# Patient Record
Sex: Male | Born: 1980 | Race: White | Hispanic: No | Marital: Single | Smoking: Former smoker
Health system: Southern US, Community
[De-identification: ages and names within clinical notes are randomized; demographics above are authoritative.]

## PROBLEM LIST (undated history)

## (undated) HISTORY — PX: WISDOM TOOTH EXTRACTION: SHX21

## (undated) HISTORY — PX: MULTIPLE TOOTH EXTRACTIONS: SHX2053

---

## 2013-11-02 HISTORY — PX: CLAVICLE SURGERY: SHX598

## 2016-10-24 DIAGNOSIS — J209 Acute bronchitis, unspecified: Secondary | ICD-10-CM | POA: Diagnosis not present

## 2017-03-01 ENCOUNTER — Other Ambulatory Visit: Payer: Self-pay | Admitting: Family Medicine

## 2017-03-01 ENCOUNTER — Encounter: Payer: Self-pay | Admitting: Family Medicine

## 2017-03-01 ENCOUNTER — Telehealth: Payer: Self-pay

## 2017-03-01 ENCOUNTER — Ambulatory Visit (INDEPENDENT_AMBULATORY_CARE_PROVIDER_SITE_OTHER): Payer: BLUE CROSS/BLUE SHIELD | Admitting: Family Medicine

## 2017-03-01 ENCOUNTER — Encounter (INDEPENDENT_AMBULATORY_CARE_PROVIDER_SITE_OTHER): Payer: Self-pay

## 2017-03-01 VITALS — BP 106/76 | HR 58 | Temp 98.0°F | Ht 76.0 in | Wt 223.2 lb

## 2017-03-01 DIAGNOSIS — N50812 Left testicular pain: Secondary | ICD-10-CM

## 2017-03-01 DIAGNOSIS — Z113 Encounter for screening for infections with a predominantly sexual mode of transmission: Secondary | ICD-10-CM

## 2017-03-01 DIAGNOSIS — M533 Sacrococcygeal disorders, not elsewhere classified: Secondary | ICD-10-CM | POA: Diagnosis not present

## 2017-03-01 LAB — POC URINALSYSI DIPSTICK (AUTOMATED)
Bilirubin, UA: NEGATIVE
Blood, UA: NEGATIVE
Glucose, UA: NEGATIVE
KETONES UA: NEGATIVE
LEUKOCYTES UA: NEGATIVE
NITRITE UA: NEGATIVE
PROTEIN UA: NEGATIVE
Spec Grav, UA: 1.025 (ref 1.010–1.025)
UROBILINOGEN UA: 0.2 U/dL
pH, UA: 6.5 (ref 5.0–8.0)

## 2017-03-01 LAB — CBC WITH DIFFERENTIAL/PLATELET
BASOS ABS: 0 10*3/uL (ref 0.0–0.1)
Basophils Relative: 0.9 % (ref 0.0–3.0)
EOS PCT: 6.5 % — AB (ref 0.0–5.0)
Eosinophils Absolute: 0.2 10*3/uL (ref 0.0–0.7)
HCT: 44.5 % (ref 39.0–52.0)
HEMOGLOBIN: 14.7 g/dL (ref 13.0–17.0)
Lymphocytes Relative: 45.7 % (ref 12.0–46.0)
Lymphs Abs: 1.3 10*3/uL (ref 0.7–4.0)
MCHC: 33 g/dL (ref 30.0–36.0)
MCV: 92.1 fl (ref 78.0–100.0)
MONO ABS: 0.3 10*3/uL (ref 0.1–1.0)
Monocytes Relative: 11.8 % (ref 3.0–12.0)
Neutro Abs: 1 10*3/uL — ABNORMAL LOW (ref 1.4–7.7)
Neutrophils Relative %: 35.1 % — ABNORMAL LOW (ref 43.0–77.0)
Platelets: 131 10*3/uL — ABNORMAL LOW (ref 150.0–400.0)
RBC: 4.83 Mil/uL (ref 4.22–5.81)
RDW: 12.9 % (ref 11.5–15.5)
WBC: 2.9 10*3/uL — AB (ref 4.0–10.5)

## 2017-03-01 LAB — SEDIMENTATION RATE: SED RATE: 1 mm/h (ref 0–15)
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
## 2017-03-01 NOTE — Assessment & Plan Note (Signed)
Longstanding low grade dull discomfort, most recently localizing to L testicle. Exam benign today, not consistent with STI, hernia, no masses appreciated, prostate nontender and nonboggy pointing against prostatitis.  ?chronic prostatitis vs congestive epididymitis.  ?referred pain from sacrum. He does wear restrictive sports briefs - rec looser boxers. rec 1 wk aleve  course scheduled BID.  Check testicular US given longevity of sxs.  Consider urology referral. Pt agrees with plan.

## 2017-03-01 NOTE — Patient Instructions (Addendum)
Labs today. Urine check today.  We will refer you for scrotal ultrasound. See Shirlee Limerick on your way out to schedule. We will be in touch with results.  Use looser boxers. Try aleve course  twice daily with meals for 1 week.  Good to meet you today, call us with questions.  Return in 3 months for physical.

## 2017-03-01 NOTE — Telephone Encounter (Signed)
Ordered
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
## 2017-03-01 NOTE — Telephone Encounter (Signed)
Cathy at [facility] left v/m; needs order for Ultrasound doppler be added in Epic; this needs to be added to US scrotum. IMG code 2191. As soon as order is put in can contact pt to schedule appt.Please advise.

## 2017-03-02 LAB — RPR

## 2017-03-02 LAB — GC/CHLAMYDIA PROBE AMP
CT Probe RNA: NOT DETECTED
GC PROBE AMP APTIMA: NOT DETECTED

## 2017-03-02 LAB — HIV ANTIBODY (ROUTINE TESTING W REFLEX): HIV 1&2 Ab, 4th Generation: NONREACTIVE

## 2017-03-03 ENCOUNTER — Other Ambulatory Visit: Payer: Self-pay | Admitting: Family Medicine

## 2017-03-03 DIAGNOSIS — D709 Neutropenia, unspecified: Secondary | ICD-10-CM

## 2017-03-03 DIAGNOSIS — D696 Thrombocytopenia, unspecified: Secondary | ICD-10-CM

## 2017-03-15 ENCOUNTER — Other Ambulatory Visit: Payer: BLUE CROSS/BLUE SHIELD

## 2017-03-30 ENCOUNTER — Ambulatory Visit
Admission: RE | Admit: 2017-03-30 | Discharge: 2017-03-30 | Disposition: A | Discharge status: Home / Self Care | Payer: BLUE CROSS/BLUE SHIELD | Source: Ambulatory Visit | Attending: Family Medicine | Admitting: Family Medicine

## 2017-03-30 DIAGNOSIS — N50812 Left testicular pain: Secondary | ICD-10-CM | POA: Diagnosis not present

## 2017-04-01 ENCOUNTER — Telehealth: Payer: Self-pay

## 2017-04-01 NOTE — Telephone Encounter (Signed)
Ok to refill - let us know when he needs refill. Will recommend yearly physical and will discuss pros/cons of medication at next OV.

## 2017-04-01 NOTE — Telephone Encounter (Signed)
Pt left v/m; pt established care on 03/01/17; pt received finasteride 5 mg taking 1/4 pill daily for thinning hair by [facility]. Pt wants to know if Dr Reece AgarG could begin to prescribe the Finasteride. Pt request cb. [facility] [address].

## 2017-04-02 MED ORDER — FINASTERIDE 5 MG PO TABS: 1.2500 mg | ORAL_TABLET | Freq: Every day | ORAL | 1 refills | Status: DC

## 2017-04-02 NOTE — Telephone Encounter (Signed)
Lm on pts vm requesting a call back. Rx sent to pharmacy

## 2017-04-02 NOTE — Telephone Encounter (Signed)
[name]  Patient can be reached [phone].

## 2017-04-05 NOTE — Telephone Encounter (Signed)
Spoke to pt and advised per Dr Reece AgarG. CPE scheduled for 8/1

## 2017-06-02 ENCOUNTER — Encounter: Payer: BLUE CROSS/BLUE SHIELD | Admitting: Family Medicine

## 2017-06-11 ENCOUNTER — Encounter: Payer: BLUE CROSS/BLUE SHIELD | Admitting: Family Medicine

## 2017-12-07 ENCOUNTER — Ambulatory Visit (INDEPENDENT_AMBULATORY_CARE_PROVIDER_SITE_OTHER): Payer: BLUE CROSS/BLUE SHIELD | Admitting: Family Medicine

## 2017-12-07 ENCOUNTER — Other Ambulatory Visit: Payer: BLUE CROSS/BLUE SHIELD

## 2017-12-07 ENCOUNTER — Encounter: Payer: Self-pay | Admitting: Family Medicine

## 2017-12-07 VITALS — BP 122/80 | HR 61 | Temp 98.2°F | Ht 76.0 in | Wt 225.5 lb

## 2017-12-07 DIAGNOSIS — Z23 Encounter for immunization: Secondary | ICD-10-CM

## 2017-12-07 DIAGNOSIS — Z131 Encounter for screening for diabetes mellitus: Secondary | ICD-10-CM

## 2017-12-07 DIAGNOSIS — D696 Thrombocytopenia, unspecified: Secondary | ICD-10-CM

## 2017-12-07 DIAGNOSIS — Z0000 Encounter for general adult medical examination without abnormal findings: Secondary | ICD-10-CM

## 2017-12-07 DIAGNOSIS — Z13220 Encounter for screening for lipoid disorders: Secondary | ICD-10-CM | POA: Diagnosis not present

## 2017-12-07 DIAGNOSIS — L649 Androgenic alopecia, unspecified: Secondary | ICD-10-CM | POA: Diagnosis not present

## 2017-12-07 DIAGNOSIS — D709 Neutropenia, unspecified: Secondary | ICD-10-CM | POA: Diagnosis not present

## 2017-12-07 LAB — COMPREHENSIVE METABOLIC PANEL
ALT: 13 U/L (ref 0–53)
AST: 15 U/L (ref 0–37)
Albumin: 4.5 g/dL (ref 3.5–5.2)
Alkaline Phosphatase: 54 U/L (ref 39–117)
BUN: 20 mg/dL (ref 6–23)
CHLORIDE: 104 meq/L (ref 96–112)
CO2: 31 meq/L (ref 19–32)
CREATININE: 0.9 mg/dL (ref 0.40–1.50)
Calcium: 9.3 mg/dL (ref 8.4–10.5)
GFR: 101.68 mL/min (ref >60.00)
GLUCOSE: 88 mg/dL (ref 70–99)
POTASSIUM: 4.2 meq/L (ref 3.5–5.1)
SODIUM: 140 meq/L (ref 135–145)
Total Bilirubin: 0.7 mg/dL (ref 0.2–1.2)
Total Protein: 7.1 g/dL (ref 6.0–8.3)

## 2017-12-07 LAB — TSH: TSH: 1.77 u[IU]/mL (ref 0.35–4.50)

## 2017-12-07 LAB — CBC WITH DIFFERENTIAL/PLATELET
BASOS PCT: 0.9 % (ref 0.0–3.0)
Basophils Absolute: 0 10*3/uL (ref 0.0–0.1)
EOS PCT: 4.9 % (ref 0.0–5.0)
Eosinophils Absolute: 0.1 10*3/uL (ref 0.0–0.7)
HCT: 42.4 % (ref 39.0–52.0)
Hemoglobin: 14.5 g/dL (ref 13.0–17.0)
LYMPHS ABS: 1.3 10*3/uL (ref 0.7–4.0)
Lymphocytes Relative: 46.7 % — ABNORMAL HIGH (ref 12.0–46.0)
MCHC: 34.2 g/dL (ref 30.0–36.0)
MCV: 90.7 fl (ref 78.0–100.0)
MONO ABS: 0.3 10*3/uL (ref 0.1–1.0)
MONOS PCT: 11.8 % (ref 3.0–12.0)
NEUTROS ABS: 1 10*3/uL — AB (ref 1.4–7.7)
NEUTROS PCT: 35.7 % — AB (ref 43.0–77.0)
Platelets: 155 10*3/uL (ref 150.0–400.0)
RBC: 4.68 Mil/uL (ref 4.22–5.81)
RDW: 12.1 % (ref 11.5–15.5)
WBC: 2.9 10*3/uL — ABNORMAL LOW (ref 4.0–10.5)

## 2017-12-07 LAB — LIPID PANEL
CHOL/HDL RATIO: 5
Cholesterol: 185 mg/dL (ref 0–200)
HDL: 38.4 mg/dL — ABNORMAL LOW (ref >39.00)
LDL CALC: 130 mg/dL — AB (ref 0–99)
NonHDL: 146.74
Triglycerides: 83 mg/dL (ref 0.0–149.0)
VLDL: 16.6 mg/dL (ref 0.0–40.0)

## 2017-12-07 MED ORDER — FINASTERIDE 5 MG PO TABS: 1.2500 mg | ORAL_TABLET | Freq: Every day | ORAL | 6 refills | Status: DC

## 2017-12-07 NOTE — Assessment & Plan Note (Signed)
Preventative protocols reviewed and updated unless pt declined. Discussed healthy diet and lifestyle.  

## 2017-12-07 NOTE — Assessment & Plan Note (Signed)
Update CBC. 

## 2017-12-07 NOTE — Patient Instructions (Addendum)
Flu shot and Hep A shot today - to complete Hep A series.  Labs today.  You are doing well - continue healthy diet and lifestyle changes.  Return as needed or in 1 year for next physical.  Health Maintenance, Male A healthy lifestyle and preventive care is important for your health and wellness. Ask your health care provider about what schedule of regular examinations is right for you. What should I know about weight and diet? Eat a Healthy Diet  Eat plenty of vegetables, fruits, whole grains, low-fat dairy products, and lean protein.  Do not eat a lot of foods high in solid fats, added sugars, or salt.  Maintain a Healthy Weight Regular exercise can help you achieve or maintain a healthy weight. You should:  Do at least 150 minutes of exercise each week. The exercise should increase your heart rate and make you sweat (moderate-intensity exercise).  Do strength-training exercises at least twice a week.  Watch Your Levels of Cholesterol and Blood Lipids  Have your blood tested for lipids and cholesterol every 5 years starting at 36 years of age. If you are at high risk for heart disease, you should start having your blood tested when you are 36 years old. You may need to have your cholesterol levels checked more often if: ? Your lipid or cholesterol levels are high. ? You are older than 36 years of age. ? You are at high risk for heart disease.  What should I know about cancer screening? Many types of cancers can be detected early and may often be prevented. Lung Cancer  You should be screened every year for lung cancer if: ? You are a current smoker who has smoked for at least 30 years. ? You are a former smoker who has quit within the past 15 years.  Talk to your health care provider about your screening options, when you should start screening, and how often you should be screened.  Colorectal Cancer  Routine colorectal cancer screening usually begins at 36 years of age and  should be repeated every 5-10 years until you are 36 years old. You may need to be screened more often if early forms of precancerous polyps or small growths are found. Your health care provider may recommend screening at an earlier age if you have risk factors for colon cancer.  Your health care provider may recommend using home test kits to check for hidden blood in the stool.  A small camera at the end of a tube can be used to examine your colon (sigmoidoscopy or colonoscopy). This checks for the earliest forms of colorectal cancer.  Prostate and Testicular Cancer  Depending on your age and overall health, your health care provider may do certain tests to screen for prostate and testicular cancer.  Talk to your health care provider about any symptoms or concerns you have about testicular or prostate cancer.  Skin Cancer  Check your skin from head to toe regularly.  Tell your health care provider about any new moles or changes in moles, especially if: ? There is a change in a mole's size, shape, or color. ? You have a mole that is larger than a pencil eraser.  Always use sunscreen. Apply sunscreen liberally and repeat throughout the day.  Protect yourself by wearing long sleeves, pants, a wide-brimmed hat, and sunglasses when outside.  What should I know about heart disease, diabetes, and high blood pressure?  If you are 3018-36 years of age, have your blood  pressure checked every 3-5 years. If you are 69 years of age or older, have your blood pressure checked every year. You should have your blood pressure measured twice-once when you are at a hospital or clinic, and once when you are not at a hospital or clinic. Record the average of the two measurements. To check your blood pressure when you are not at a hospital or clinic, you can use: ? An automated blood pressure machine at a pharmacy. ? A home blood pressure monitor.  Talk to your health care provider about your target blood  pressure.  If you are between 10-25 years old, ask your health care provider if you should take aspirin to prevent heart disease.  Have regular diabetes screenings by checking your fasting blood sugar level. ? If you are at a normal weight and have a low risk for diabetes, have this test once every three years after the age of 101. ? If you are overweight and have a high risk for diabetes, consider being tested at a younger age or more often.  A one-time screening for abdominal aortic aneurysm (AAA) by ultrasound is recommended for men aged 70-75 years who are current or former smokers. What should I know about preventing infection? Hepatitis B If you have a higher risk for hepatitis B, you should be screened for this virus. Talk with your health care provider to find out if you are at risk for hepatitis B infection. Hepatitis C Blood testing is recommended for:  Everyone born from [birth year] through 1964.  Anyone with known risk factors for hepatitis C.  Sexually Transmitted Diseases (STDs)  You should be screened each year for STDs including gonorrhea and chlamydia if: ? You are sexually active and are younger than 36 years of age. ? You are older than 36 years of age and your health care provider tells you that you are at risk for this type of infection. ? Your sexual activity has changed since you were last screened and you are at an increased risk for chlamydia or gonorrhea. Ask your health care provider if you are at risk.  Talk with your health care provider about whether you are at high risk of being infected with HIV. Your health care provider may recommend a prescription medicine to help prevent HIV infection.  What else can I do?  Schedule regular health, dental, and eye exams.  Stay current with your vaccines (immunizations).  Do not use any tobacco products, such as cigarettes, chewing tobacco, and e-cigarettes. If you need help quitting, ask your health care  provider.  Limit alcohol intake to no more than 2 drinks per day. One drink equals 12 ounces of beer, 5 ounces of wine, or 1 ounces of hard liquor.  Do not use street drugs.  Do not share needles.  Ask your health care provider for help if you need support or information about quitting drugs.  Tell your health care provider if you often feel depressed.  Tell your health care provider if you have ever been abused or do not feel safe at home. This information is not intended to replace advice given to you by your health care provider. Make sure you discuss any questions you have with your health care provider. Document Released: 04/16/2008 Document Revised: 06/17/2016 Document Reviewed: 07/23/2015 Elsevier Interactive Patient Education  [employer].

## 2017-12-07 NOTE — Assessment & Plan Note (Signed)
Discussed mechanism of action as well as possible side effects - minimal at low dose he uses. Reviewed possible decreased risk of prostate cancer, but possible increased association for high grade prostate cancer if this develops. Pt desires to continue medication at this time.

## 2017-12-07 NOTE — Progress Notes (Addendum)
BP 122/80 (BP Location: Right Arm, Patient Position: Sitting, Cuff Size: Normal)   Pulse 61   Temp 98.2 F (36.8 C) (Oral)   Ht 6\' 4"  (1.93 m)   Wt 225 lb 8 oz (102.3 kg)   SpO2 99%   BMI 27.45 kg/m    CC: CPE Subjective:    Patient ID: Hall BusingWilliam Bradford Cavanaugh, male    DOB: 10/30/1981, 37 y.o.   MRN: [id number]  HPI: Hall BusingWilliam Bradford Berberich is a 37 y.o. male presenting on 12/07/2017 for Annual Exam   On finasteride 5mg  1/4 tab daily for thinning hair. [facility]. Desires PCP continue filling.   Preventative: Not currently sexually active. STD screen last year normal. Declines rpt today.  Flu shot yearly Tdap 2014 Hep A 2014, unsure if he completed series  Seat belt use discussed Sunscreen use discussed. No changing moles on skin.  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 Interim medical history since our last visit reviewed. Allergies and medications reviewed and updated. Outpatient Medications Prior to Visit  Medication Sig Dispense Refill  . finasteride (PROSCAR) 5 MG tablet Take 0.5 tablets (2.5 mg total) by mouth daily. 30 tablet 1   No facility-administered medications prior to visit.      Per HPI unless specifically indicated in ROS section below Review of Systems  Constitutional: Negative for activity change, appetite change, chills, fatigue, fever and unexpected weight change.  HENT: Negative for hearing loss.   Eyes: Negative for visual disturbance.  Respiratory: Negative for cough, chest tightness, shortness of breath and wheezing.   Cardiovascular: Negative for chest pain, palpitations and leg swelling.  Gastrointestinal:  Negative for abdominal distention, abdominal pain, blood in stool, constipation, diarrhea, nausea and vomiting.  Genitourinary: Negative for difficulty urinating and hematuria.  Musculoskeletal: Negative for arthralgias, myalgias and neck pain.  Skin: Negative for rash.  Neurological: Negative for dizziness, seizures, syncope and headaches.  Hematological: Negative for adenopathy. Does not bruise/bleed easily.  Psychiatric/Behavioral: Negative for dysphoric mood. The patient is not nervous/anxious.        Objective:    BP 122/80 (BP Location: Right Arm, Patient Position: Sitting, Cuff Size: Normal)   Pulse 61   Temp 98.2 F (36.8 C) (Oral)   Ht 6\' 4"  (1.93 m)   Wt 225 lb 8 oz (102.3 kg)   SpO2 99%   BMI 27.45 kg/m   Wt Readings from Last 3 Encounters:  12/07/17 225 lb 8 oz (102.3 kg)  03/01/17 223 lb 4 oz (101.3 kg)    Physical Exam  Constitutional: He is oriented to person, place, and time. He appears well-developed and well-nourished. No distress.  HENT:  Head: Normocephalic and atraumatic.  Right Ear: Hearing, tympanic membrane, external ear and ear canal normal.  Left Ear: Hearing, tympanic membrane, external ear and ear canal normal.  Nose: Nose normal.  Mouth/Throat: Uvula is midline, oropharynx is clear and moist and mucous membranes are normal. No oropharyngeal exudate, posterior oropharyngeal edema or posterior oropharyngeal erythema.  Eyes: Conjunctivae and EOM are normal. Pupils are equal, round, and reactive to light. No scleral icterus.  Neck: Normal range of motion. Neck supple. No thyromegaly present.  Cardiovascular:  Normal rate, regular rhythm, normal heart sounds and intact distal pulses.  No murmur heard. Pulses:      Radial pulses are 2+ on the right side, and 2+ on the left side.  Pulmonary/Chest: Effort normal and breath sounds normal. No respiratory distress. He has no wheezes. He has no rales.  Abdominal: Soft. Bowel sounds are normal. He exhibits no  distension and no mass. There is no tenderness. There is no rebound and no guarding.  Musculoskeletal: Normal range of motion. He exhibits no edema.  Lymphadenopathy:    He has no cervical adenopathy.  Neurological: He is alert and oriented to person, place, and time.  CN grossly intact, station and gait intact  Skin: Skin is warm and dry. No rash noted.  Psychiatric: He has a normal mood and affect. His behavior is normal. Judgment and thought content normal.  Nursing note and vitals reviewed.  Results for orders placed or performed in visit on 03/01/17  GC/Chlamydia Probe Amp  Result Value Ref Range   CT Probe RNA NOT DETECTED    GC Probe RNA NOT DETECTED   HIV antibody  Result Value Ref Range   HIV 1&2 Ab, 4th Generation NONREACTIVE NONREACTIVE  RPR  Result Value Ref Range   RPR Ser Ql NON REAC NON REAC  Sedimentation rate  Result Value Ref Range   Sed Rate 1 0 - 15 mm/hr  CBC with Differential/Platelet  Result Value Ref Range   WBC 2.9 (L) 4.0 - 10.5 K/uL   RBC 4.83 4.22 - 5.81 Mil/uL   Hemoglobin 14.7 13.0 - 17.0 g/dL   HCT 40.9 81.1 - 91.4 %   MCV 92.1 78.0 - 100.0 fl   MCHC 33.0 30.0 - 36.0 g/dL   RDW 78.2 95.6 - 21.3 %   Platelets 131.0 (L) 150.0 - 400.0 K/uL   Neutrophils Relative % 35.1 (L) 43.0 - 77.0 %   Lymphocytes Relative 45.7 12.0 - 46.0 %   Monocytes Relative 11.8 3.0 - 12.0 %   Eosinophils Relative 6.5 (H) 0.0 - 5.0 %   Basophils Relative 0.9 0.0 - 3.0 %   Neutro Abs 1.0 (L) 1.4 - 7.7 K/uL   Lymphs Abs 1.3 0.7 - 4.0 K/uL   Monocytes Absolute 0.3 0.1 - 1.0 K/uL   Eosinophils Absolute 0.2 0.0 - 0.7 K/uL   Basophils Absolute 0.0 0.0 - 0.1 K/uL  POCT Urinalysis Dipstick (Automated)  Result Value Ref Range   Color, UA yeloow    Clarity, UA clear    Glucose, UA neg    Bilirubin, UA neg    Ketones, UA neg    Spec Grav, UA 1.025 1.010 - 1.025   Blood, UA neg    pH, UA 6.5 5.0 - 8.0   Protein, UA neg    Urobilinogen, UA 0.2 0.2 or 1.0 E.U./dL    Nitrite, UA neg    Leukocytes, UA Negative Negative      Assessment & Plan:   Problem List Items Addressed This Visit    Androgenic alopecia    Discussed mechanism of action as well as possible side effects - minimal at low dose he uses. Reviewed possible decreased risk of prostate cancer, but possible increased association for high grade prostate cancer if this develops. Pt desires to continue medication at this time.       Health maintenance examination - Primary    Preventative protocols reviewed and updated unless pt declined. Discussed healthy diet and lifestyle.  Neutropenia (HCC)    Update CBC      Relevant Orders   TSH   CBC with Differential/Platelet   Pathologist smear review   Thrombocytopenia (HCC)   Relevant Orders   Pathologist smear review    Other Visit Diagnoses    Need for influenza vaccination       Relevant Orders   Flu Vaccine QUAD 6+ mos PF IM (Fluarix Quad PF) (Completed)   Lipid screening       Relevant Orders   Lipid panel   Diabetes mellitus screening       Relevant Orders   Comprehensive metabolic panel       Follow up plan: Return in about 1 year (around 12/07/2018) for annual exam, prior fasting for blood work.  Eustaquio Boyden, MD

## 2017-12-07 NOTE — Addendum Note (Signed)
Addended by: Nanci PinaGOINS, Emonte Dieujuste on: 12/07/2017 10:47 AM   Modules accepted: Orders

## 2017-12-08 LAB — PATHOLOGIST SMEAR REVIEW

## 2018-01-10 ENCOUNTER — Encounter: Payer: Self-pay | Admitting: Family Medicine

## 2018-03-27 ENCOUNTER — Encounter: Payer: Self-pay | Admitting: Family Medicine

## 2018-04-01 ENCOUNTER — Ambulatory Visit (INDEPENDENT_AMBULATORY_CARE_PROVIDER_SITE_OTHER): Payer: BLUE CROSS/BLUE SHIELD | Admitting: Family Medicine

## 2018-04-01 ENCOUNTER — Encounter: Payer: Self-pay | Admitting: Family Medicine

## 2018-04-01 VITALS — BP 118/68 | HR 70 | Temp 98.5°F | Ht 76.0 in | Wt 230.8 lb

## 2018-04-01 DIAGNOSIS — K219 Gastro-esophageal reflux disease without esophagitis: Secondary | ICD-10-CM | POA: Insufficient documentation

## 2018-04-01 DIAGNOSIS — J029 Acute pharyngitis, unspecified: Secondary | ICD-10-CM | POA: Diagnosis not present

## 2018-04-01 DIAGNOSIS — J392 Other diseases of pharynx: Secondary | ICD-10-CM | POA: Insufficient documentation

## 2018-04-01 NOTE — Patient Instructions (Signed)
You had acute pharyngitis - either viral or irritative. Push fluids and plenty of rest. May use ibuprofen for throat inflammation. Watch for fever >101.5, worsening pain, or trouble opening/closing mouth, or hoarse voice.  Good to see you today, call clinic with questions.

## 2018-04-01 NOTE — Assessment & Plan Note (Signed)
Anticipate viral vs irritant pharyngitis. Supportive care reviewed - NSAID, fluids. Should improve over time. 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 Update if not improving with treatment.

## 2018-04-01 NOTE — Progress Notes (Signed)
BP 118/68 (BP Location: Left Arm, Patient Position: Sitting, Cuff Size: Large)   Pulse 70   Temp 98.5 F (36.9 C) (Oral)   Ht 6\' 4"  (1.93 m)   Wt 230 lb 12 oz (104.7 kg)   SpO2 96%   BMI 28.09 kg/m    CC: ST Subjective:    Patient ID: Anthony Martin, male    DOB: 10-05-1981, 37 y.o.   MRN: [id number]  HPI: Anthony Martin is a 37 y.o. male presenting on 04/01/2018 for Sore Throat (Has had about 2 wks. Not "normal" sore throat. Feels dry and like there is a lump. Disturbs pt's sleep. )   2 wk h/o ST, lingering. Throat feels dry, "lump in throat". Can wake him up at night. Slowly improving. Initially did have dry cough which has improved.   Never had congestion, rhinorrhea, fevers/chills, PNDrainage.  Treated with cough drops and nyquil.  No sick contacts at home.   Has stopped vaping.   Relevant past medical, surgical, family and social history reviewed and updated as indicated. Interim medical history since our last visit reviewed. Allergies and medications reviewed and updated. Outpatient Medications Prior to Visit  Medication Sig Dispense Refill  . finasteride (PROSCAR) 5 MG tablet Take 0.5 tablets (2.5 mg total) by mouth daily. 30 tablet 6   No facility-administered medications prior to visit.      Per HPI unless specifically indicated in ROS section below Review of Systems     Objective:    BP 118/68 (BP Location: Left Arm, Patient Position: Sitting, Cuff Size: Large)   Pulse 70   Temp 98.5 F (36.9 C) (Oral)   Ht 6\' 4"  (1.93 m)   Wt 230 lb 12 oz (104.7 kg)   SpO2 96%   BMI 28.09 kg/m   Wt Readings from Last 3 Encounters:  04/01/18 230 lb 12 oz (104.7 kg)  12/07/17 225 lb 8 oz (102.3 kg)  03/01/17 223 lb 4 oz (101.3 kg)    Physical Exam  Constitutional: He appears well-developed and well-nourished. No distress.  HENT:  Head: Normocephalic and atraumatic.  Right Ear: Hearing, tympanic membrane, external ear and ear canal normal.  Left  Ear: Hearing, tympanic membrane, external ear and ear canal normal.  Nose: Nose normal. No mucosal edema or rhinorrhea.  Mouth/Throat: Uvula is midline, oropharynx is clear and moist and mucous membranes are normal. No oropharyngeal exudate, posterior oropharyngeal edema, posterior oropharyngeal erythema or tonsillar abscesses.  Mild erythema posterior soft palate  Eyes: Pupils are equal, round, and reactive to light. Conjunctivae and EOM are normal. No scleral icterus.  Neck: Normal range of motion. Neck supple.  Cardiovascular: Normal rate, regular rhythm, normal heart sounds and intact distal pulses.  No murmur heard. Pulmonary/Chest: Effort normal and breath sounds normal. No respiratory distress. He has no wheezes. He has no rales.  Lymphadenopathy:    He has no cervical adenopathy.  Skin: Skin is warm and dry. No rash noted.  Nursing note and vitals reviewed.     Assessment & Plan:   Problem List Items Addressed This Visit    Sore throat - Primary    Anticipate viral vs irritant pharyngitis. Supportive care reviewed - NSAID, fluids. Should improve over time. 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 Update if not improving with treatment.           No orders of the defined types were placed in this encounter.  No orders of the defined types were placed in  this encounter.   Follow up plan: Return if symptoms worsen or fail to improve.  Eustaquio BoydenJavier Kenisha Lynds, MD

## 2018-05-07 IMAGING — US US SCROTUM
1 series · 14 of 25 positions shown · non-contrast
Comparison: None.

CLINICAL DATA: Chronic left testicle pain.

EXAM:
SCROTAL ULTRASOUND
DOPPLER ULTRASOUND OF THE TESTICLES
TECHNIQUE: Complete ultrasound examination of the testicles, epididymis, and
other scrotal structures was performed. Color and spectral Doppler
ultrasound were also utilized to evaluate blood flow to the
testicles.

[Series 1: us scrotum · 0.08mm/px · 14 of 47 slices shown]
[im 1/47]
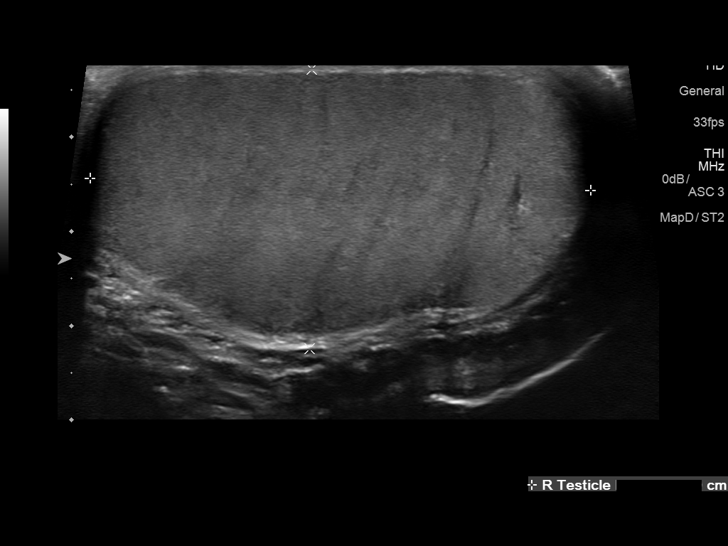
[im 4/47]
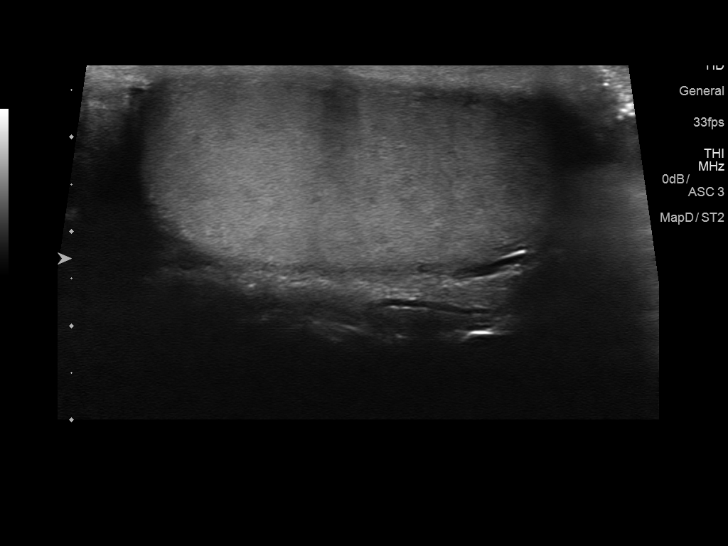
[im 8/47]
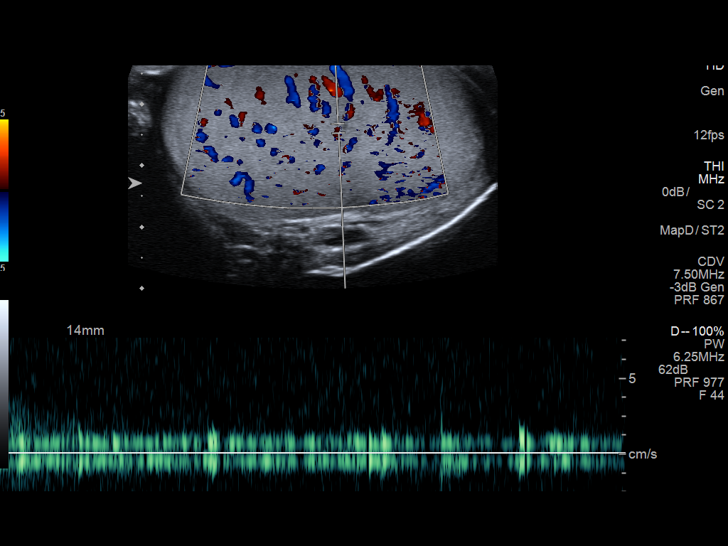
[im 12/47]
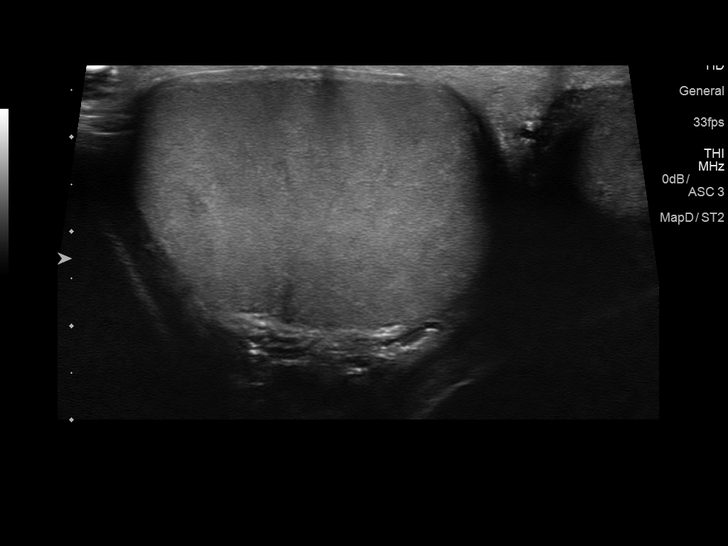
[im 16/47]
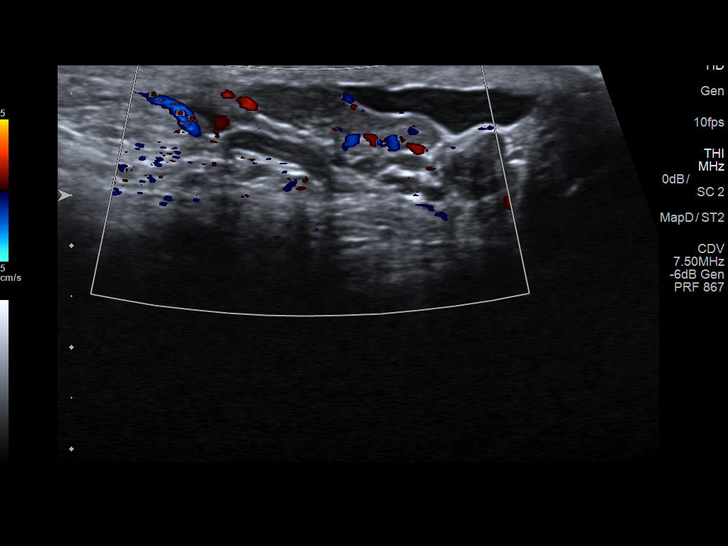
[im 18/47]
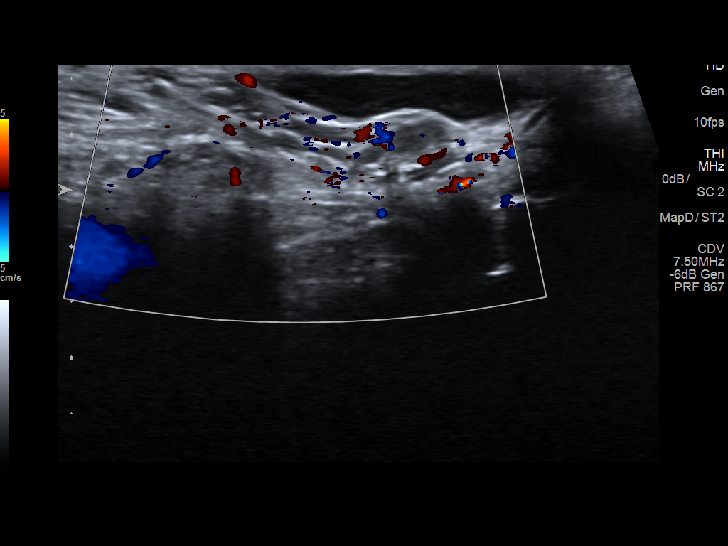
[im 22/47]
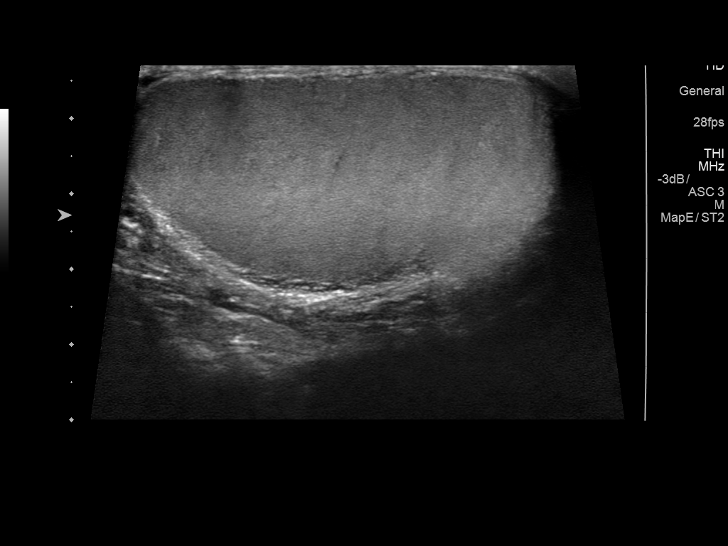
[im 25/47]
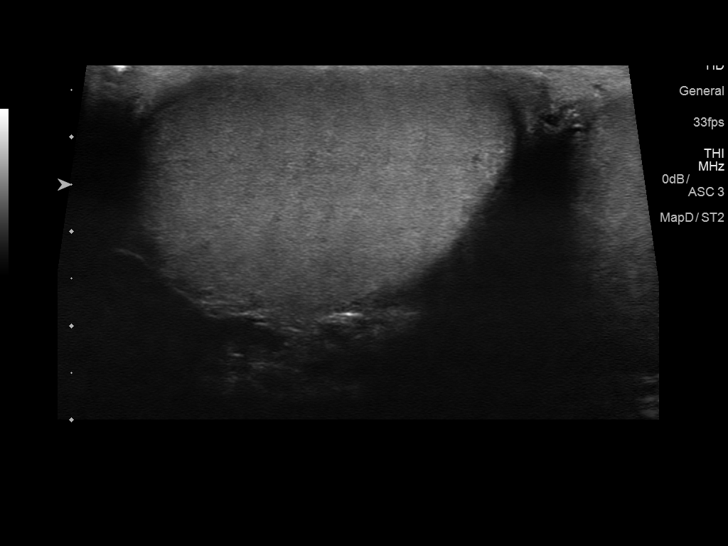
[im 29/47]
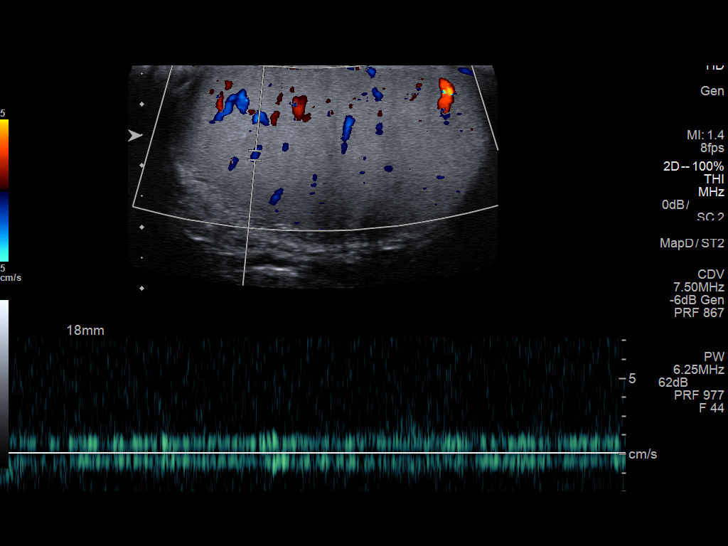
[im 31/47]
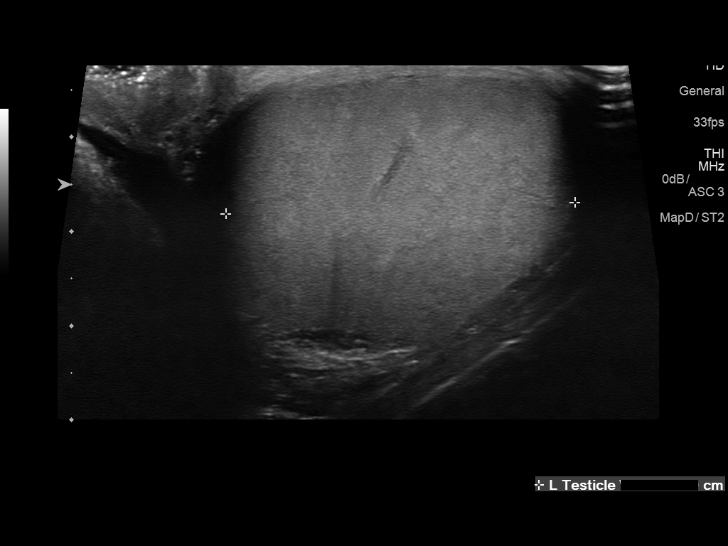
[im 35/47]
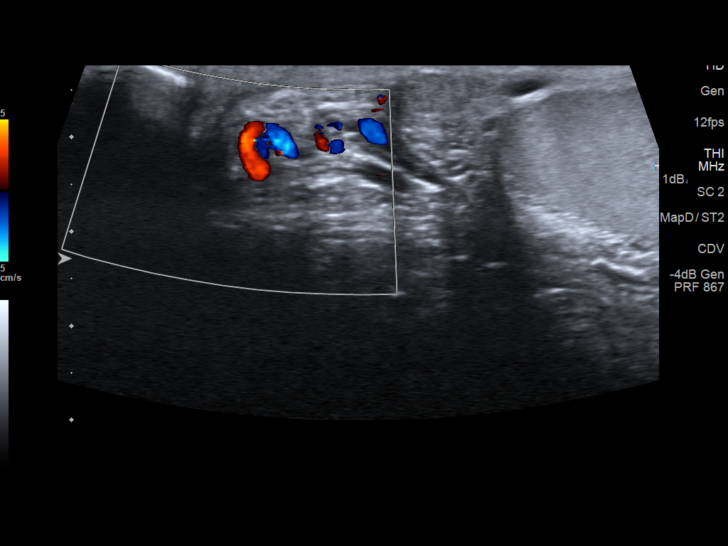
[im 39/47]
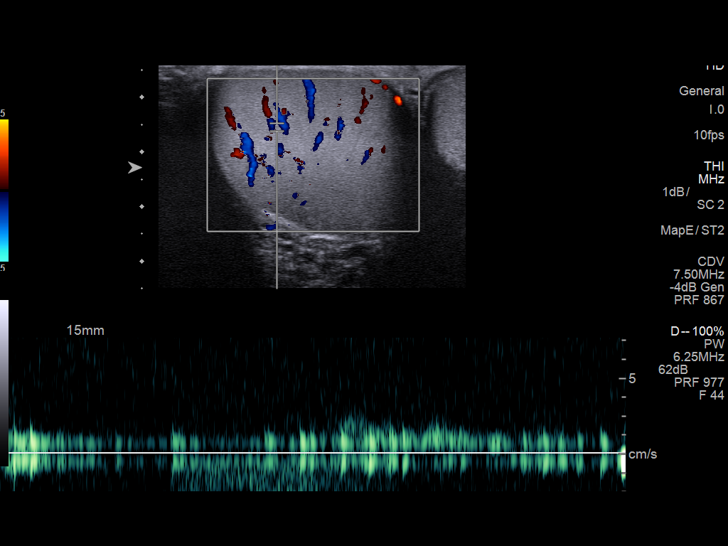
[im 43/47]
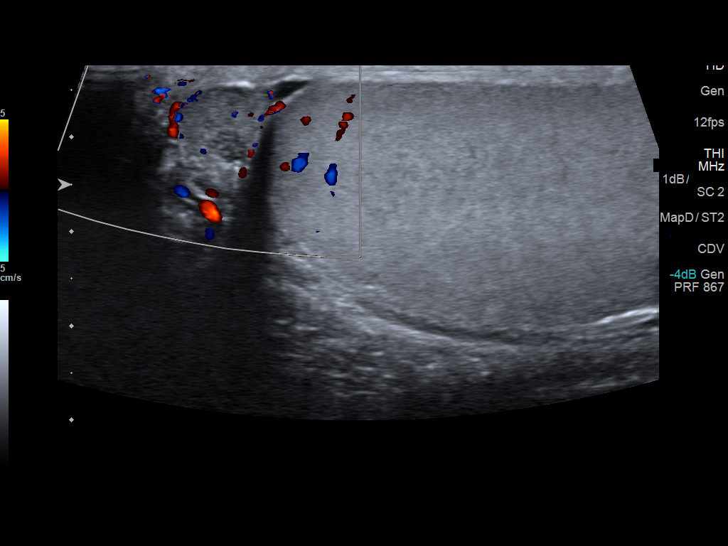
[im 47/47]
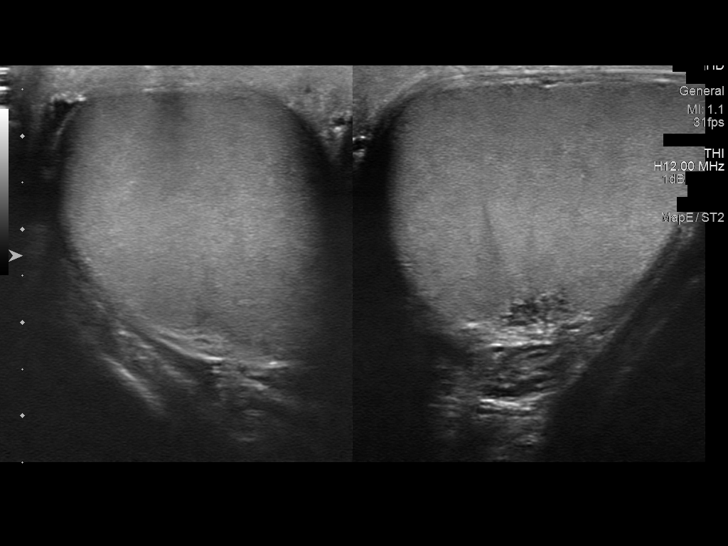

[14 of 25 positions shown; findings below may reference images not displayed]

FINDINGS: Right testicle

Measurements: 5.3 x 3.0 x 3.8 cm. No mass or microlithiasis
visualized.

Left testicle

Measurements: 5.3 x 2.9 x 3.7 cm. No mass or microlithiasis
visualized.

Right epididymis:  Normal in size and appearance.

Left epididymis:  Normal in size and appearance.

Hydrocele:  None visualized.

Varicocele:  None visualized.

Pulsed Doppler interrogation of both testes demonstrates normal low
resistance arterial and venous waveforms bilaterally.
IMPRESSION: Normal exam.

## 2018-11-15 ENCOUNTER — Telehealth: Payer: Self-pay | Admitting: Family Medicine

## 2018-11-15 NOTE — Telephone Encounter (Signed)
Left message asking pt to call office please r/s 2/10 appointment with dr g °

## 2018-12-09 ENCOUNTER — Other Ambulatory Visit: Payer: BLUE CROSS/BLUE SHIELD

## 2018-12-12 ENCOUNTER — Encounter: Payer: BLUE CROSS/BLUE SHIELD | Admitting: Family Medicine

## 2019-01-08 ENCOUNTER — Other Ambulatory Visit: Payer: Self-pay | Admitting: Family Medicine

## 2019-01-12 NOTE — Progress Notes (Signed)
BP 118/78 (BP Location: Left Arm, Patient Position: Sitting, Cuff Size: Large)   Pulse 74   Temp 98.2 F (36.8 C) (Oral)   Ht  (1.93 m)   Wt 234 lb 6 oz (106.3 kg)   SpO2 97%   BMI 28.53 kg/m    CC: CPE Subjective:    Patient ID: Anthony Martin, male    DOB: 03-15-1981, 38 y.o.   MRN: [id number]  HPI: Anthony Martin is a 38 y.o. 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   Noticing some trouble waking up in am, trouble staying asleep.  Last month had sinus congestion.  No daytime sleepiness. Restorative sleep. Does snore. No PN dyspnea.  H/o map dot fingerprint dystrophy.   Preventative: Not currently sexually active. STD screen last year normal. Declines rpt today.  Flu shot yearly Tdap 2014 Hep A series completed  Seat belt use discussed  Sunscreen use discussed. No changing moles on skin.  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 Interim medical history since our last visit reviewed. Allergies and medications reviewed and updated. Outpatient Medications Prior to Visit  Medication Sig Dispense Refill  . finasteride (PROSCAR) 5 MG tablet Take 1/2 (one-half) tablet by mouth once daily 15 tablet 0   No facility-administered medications prior to visit.      Per HPI unless specifically indicated in ROS section below Review of Systems  Constitutional: Negative for activity change, appetite change, chills, fatigue, fever and unexpected weight change.  HENT:  Negative for hearing loss.   Eyes: Negative for visual disturbance.  Respiratory: Negative for cough, chest tightness, shortness of breath and wheezing.   Cardiovascular: Negative for chest pain, palpitations and leg swelling.  Gastrointestinal: Negative for abdominal distention, abdominal pain, blood in stool, constipation, diarrhea, nausea and vomiting.  Genitourinary: Negative for difficulty urinating and hematuria.  Musculoskeletal: Negative for arthralgias, myalgias and neck pain.  Skin: Negative for rash.  Neurological: Negative for dizziness, seizures, syncope and headaches.  Hematological: Negative for adenopathy. Does not bruise/bleed easily.  Psychiatric/Behavioral: Negative for dysphoric mood. The patient is not nervous/anxious.    Objective:    BP 118/78 (BP Location: Left Arm, Patient Position: Sitting, Cuff Size: Large)   Pulse 74   Temp 98.2 F (36.8 C) (Oral)   Ht  (1.93 m)   Wt 234 lb 6 oz (106.3 kg)   SpO2 97%   BMI 28.53 kg/m   Wt Readings from Last 3 Encounters:  01/13/19 234 lb 6 oz (106.3 kg)  04/01/18 230 lb 12 oz (104.7 kg)  12/07/17 225 lb 8 oz (102.3 kg)    Physical Exam Vitals signs and nursing note reviewed.  Constitutional:      General: He is not in acute distress.    Appearance: Normal appearance. He is well-developed.  HENT:     Head: Normocephalic and atraumatic.     Right Ear: Hearing, tympanic membrane, ear canal and external ear normal.     Left  Ear: Hearing, tympanic membrane, ear canal and external ear normal.     Nose: Nose normal.     Mouth/Throat:     Mouth: Mucous membranes are moist.     Pharynx: Uvula midline. No oropharyngeal exudate or posterior oropharyngeal erythema.  Eyes:     General: No scleral icterus.    Conjunctiva/sclera: Conjunctivae normal.     Pupils: Pupils are equal, round, and reactive to light.  Neck:     Musculoskeletal: Normal range of motion and neck supple.  Cardiovascular:     Rate and Rhythm:  Normal rate and regular rhythm.     Pulses: Normal pulses.          Radial pulses are 2+ on the right side and 2+ on the left side.     Heart sounds: Normal heart sounds. No murmur.  Pulmonary:     Effort: Pulmonary effort is normal. No respiratory distress.     Breath sounds: Normal breath sounds. No wheezing, rhonchi or rales.  Abdominal:     General: Abdomen is flat. Bowel sounds are normal. There is no distension.     Palpations: Abdomen is soft. There is no mass.     Tenderness: There is no abdominal tenderness. There is no guarding or rebound.  Musculoskeletal: Normal range of motion.  Lymphadenopathy:     Cervical: No cervical adenopathy.  Skin:    General: Skin is warm and dry.     Findings: No rash.  Neurological:     Mental Status: He is alert and oriented to person, place, and time.     Comments: CN grossly intact, station and gait intact  Psychiatric:        Mood and Affect: Mood normal.        Behavior: Behavior normal.        Thought Content: Thought content normal.        Judgment: Judgment normal.       Results for orders placed or performed in visit on 12/07/17  Lipid panel  Result Value Ref Range   Cholesterol 185 0 - 200 mg/dL   Triglycerides 03.1 0.0 - 149.0 mg/dL   HDL 28.11 (L) >88.67 mg/dL   VLDL 73.7 0.0 - 36.6 mg/dL   LDL Cholesterol 815 (H) 0 - 99 mg/dL   Total CHOL/HDL Ratio 5    NonHDL 146.74   Comprehensive metabolic panel  Result Value Ref Range   Sodium 140 135 - 145 mEq/L   Potassium 4.2 3.5 - 5.1 mEq/L   Chloride 104 96 - 112 mEq/L   CO2 31 19 - 32 mEq/L   Glucose, Bld 88 70 - 99 mg/dL   BUN 20 6 - 23 mg/dL   Creatinine, Ser 9.47 0.40 - 1.50 mg/dL   Total Bilirubin 0.7 0.2 - 1.2 mg/dL   Alkaline Phosphatase 54 39 - 117 U/L   AST 15 0 - 37 U/L   ALT 13 0 - 53 U/L   Total Protein 7.1 6.0 - 8.3 g/dL   Albumin 4.5 3.5 - 5.2 g/dL   Calcium 9.3 8.4 - 07.6 mg/dL   GFR 151.83 >43.73 mL/min  TSH  Result Value Ref Range   TSH 1.77 0.35 -  4.50 uIU/mL  CBC with Differential/Platelet  Result Value Ref Range   WBC 2.9 (L) 4.0 - 10.5 K/uL   RBC 4.68 4.22 - 5.81 Mil/uL   Hemoglobin 14.5 13.0 - 17.0 g/dL   HCT 57.8 97.8 - 47.8 %   MCV 90.7 78.0 -  100.0 fl   MCHC 34.2 30.0 - 36.0 g/dL   RDW 96.2 95.2 - 84.1 %   Platelets 155.0 150.0 - 400.0 K/uL   Neutrophils Relative % 35.7 (L) 43.0 - 77.0 %   Lymphocytes Relative 46.7 (H) 12.0 - 46.0 %   Monocytes Relative 11.8 3.0 - 12.0 %   Eosinophils Relative 4.9 0.0 - 5.0 %   Basophils Relative 0.9 0.0 - 3.0 %   Neutro Abs 1.0 (L) 1.4 - 7.7 K/uL   Lymphs Abs 1.3 0.7 - 4.0 K/uL   Monocytes Absolute 0.3 0.1 - 1.0 K/uL   Eosinophils Absolute 0.1 0.0 - 0.7 K/uL   Basophils Absolute 0.0 0.0 - 0.1 K/uL  Pathologist smear review  Result Value Ref Range   Path Review     Lab Results  Component Value Date   TSH 1.77 12/07/2017    Assessment & Plan:   Problem List Items Addressed This Visit    Throat irritation    Describes sensation of throat irritation at times at night, benign exam today. Not consistent with OSA. ESS = 6. ?GERD - suggested he monitor foods at night that can precede symptoms.       Neutropenia (HCC)    Reviewed with patient. Update CBC.       Relevant Orders   CBC with Differential/Platelet   Health maintenance examination - Primary    Preventative protocols reviewed and updated unless pt declined. Discussed healthy diet and lifestyle.       Dyslipidemia   Relevant Orders   Lipid panel   Basic metabolic panel   Androgenic alopecia    propecia refilled. Tolerating well.        Other Visit Diagnoses    Need for influenza vaccination       Relevant Orders   Flu Vaccine QUAD 36+ mos IM (Completed)   Screen for STD (sexually transmitted disease)       Relevant Orders   HIV Antibody (routine testing w rflx)   RPR   C. trachomatis/N. gonorrhoeae RNA       Meds ordered this encounter  Medications  . finasteride (PROSCAR) 5 MG tablet    Sig: Take  1/2 (one-half) tablet by mouth once daily    Dispense:  45 tablet    Refill:  3   Orders Placed This Encounter  Procedures  . C. trachomatis/N. gonorrhoeae RNA  . Flu Vaccine QUAD 36+ mos IM  . Lipid panel  . CBC with Differential/Platelet  . Basic metabolic panel  . HIV Antibody (routine testing w rflx)  . RPR    Follow up plan: No follow-ups on file.  Eustaquio Boyden, MD

## 2019-01-13 ENCOUNTER — Encounter: Payer: Self-pay | Admitting: Family Medicine

## 2019-01-13 ENCOUNTER — Ambulatory Visit (INDEPENDENT_AMBULATORY_CARE_PROVIDER_SITE_OTHER): Payer: 59 | Admitting: Family Medicine

## 2019-01-13 ENCOUNTER — Other Ambulatory Visit: Payer: Self-pay

## 2019-01-13 VITALS — BP 118/78 | HR 74 | Temp 98.2°F | Ht 76.0 in | Wt 234.4 lb

## 2019-01-13 DIAGNOSIS — D709 Neutropenia, unspecified: Secondary | ICD-10-CM

## 2019-01-13 DIAGNOSIS — Z0000 Encounter for general adult medical examination without abnormal findings: Secondary | ICD-10-CM

## 2019-01-13 DIAGNOSIS — L649 Androgenic alopecia, unspecified: Secondary | ICD-10-CM

## 2019-01-13 DIAGNOSIS — Z113 Encounter for screening for infections with a predominantly sexual mode of transmission: Secondary | ICD-10-CM | POA: Diagnosis not present

## 2019-01-13 DIAGNOSIS — E785 Hyperlipidemia, unspecified: Secondary | ICD-10-CM

## 2019-01-13 DIAGNOSIS — Z0001 Encounter for general adult medical examination with abnormal findings: Secondary | ICD-10-CM | POA: Diagnosis not present

## 2019-01-13 DIAGNOSIS — Z23 Encounter for immunization: Secondary | ICD-10-CM

## 2019-01-13 DIAGNOSIS — J392 Other diseases of pharynx: Secondary | ICD-10-CM

## 2019-01-13 LAB — LIPID PANEL
CHOL/HDL RATIO: 4
CHOLESTEROL: 189 mg/dL (ref 0–200)
HDL: 47.4 mg/dL (ref >39.00)
LDL Cholesterol: 126 mg/dL — ABNORMAL HIGH (ref 0–99)
NONHDL: 141.47
TRIGLYCERIDES: 79 mg/dL (ref 0.0–149.0)
VLDL: 15.8 mg/dL (ref 0.0–40.0)

## 2019-01-13 LAB — BASIC METABOLIC PANEL
BUN: 15 mg/dL (ref 6–23)
CHLORIDE: 102 meq/L (ref 96–112)
CO2: 30 mEq/L (ref 19–32)
Calcium: 9.7 mg/dL (ref 8.4–10.5)
Creatinine, Ser: 1.01 mg/dL (ref 0.40–1.50)
GFR: 83.24 mL/min (ref >60.00)
Glucose, Bld: 82 mg/dL (ref 70–99)
POTASSIUM: 4.2 meq/L (ref 3.5–5.1)
SODIUM: 139 meq/L (ref 135–145)

## 2019-01-13 LAB — CBC WITH DIFFERENTIAL/PLATELET
BASOS ABS: 0 10*3/uL (ref 0.0–0.1)
Basophils Relative: 1.1 % (ref 0.0–3.0)
EOS ABS: 0.2 10*3/uL (ref 0.0–0.7)
Eosinophils Relative: 6 % — ABNORMAL HIGH (ref 0.0–5.0)
HEMATOCRIT: 44.1 % (ref 39.0–52.0)
HEMOGLOBIN: 15.1 g/dL (ref 13.0–17.0)
LYMPHS PCT: 44 % (ref 12.0–46.0)
Lymphs Abs: 1.5 10*3/uL (ref 0.7–4.0)
MCHC: 34.3 g/dL (ref 30.0–36.0)
MCV: 90 fl (ref 78.0–100.0)
MONO ABS: 0.3 10*3/uL (ref 0.1–1.0)
Monocytes Relative: 10.1 % (ref 3.0–12.0)
Neutro Abs: 1.3 10*3/uL — ABNORMAL LOW (ref 1.4–7.7)
Neutrophils Relative %: 38.8 % — ABNORMAL LOW (ref 43.0–77.0)
Platelets: 160 10*3/uL (ref 150.0–400.0)
RBC: 4.9 Mil/uL (ref 4.22–5.81)
RDW: 13 % (ref 11.5–15.5)
WBC: 3.4 10*3/uL — AB (ref 4.0–10.5)

## 2019-01-13 MED ORDER — FINASTERIDE 5 MG PO TABS: ORAL_TABLET | ORAL | 3 refills | Status: DC

## 2019-01-13 NOTE — Assessment & Plan Note (Signed)
Preventative protocols reviewed and updated unless pt declined. Discussed healthy diet and lifestyle.  

## 2019-01-13 NOTE — Patient Instructions (Addendum)
Flu shot today Labs today.  Good to see you today, call us with questions.   Sleep hygiene checklist: 1. Avoid naps during the day 2. Avoid stimulants such as caffeine and nicotine. Avoid bedtime alcohol (it can speed onset of sleep but the body's metabolism can cause awakenings). 3. All forms of exercise help ensure sound sleep - limit vigorous exercise to morning or late afternoon 4. Avoid food too close to bedtime including chocolate (which contains caffeine) 5. Soak up natural light 6. Establish regular bedtime routine. 7. Associate bed with sleep - avoid TV, computer or phone, reading while in bed. 8. Ensure pleasant, relaxing sleep environment - quiet, dark, cool room.    Health Maintenance, Male A healthy lifestyle and preventive care is important for your health and wellness. Ask your health care provider about what schedule of regular examinations is right for you. What should I know about weight and diet? Eat a Healthy Diet  Eat plenty of vegetables, fruits, whole grains, low-fat dairy products, and lean protein.  Do not eat a lot of foods high in solid fats, added sugars, or salt.  Maintain a Healthy Weight Regular exercise can help you achieve or maintain a healthy weight. You should:  Do at least 150 minutes of exercise each week. The exercise should increase your heart rate and make you sweat (moderate-intensity exercise).  Do strength-training exercises at least twice a week. Watch Your Levels of Cholesterol and Blood Lipids  Have your blood tested for lipids and cholesterol every 5 years starting at 37 years of age. If you are at high risk for heart disease, you should start having your blood tested when you are 37 years old. You may need to have your cholesterol levels checked more often if: ? Your lipid or cholesterol levels are high. ? You are older than 37 years of age. ? You are at high risk for heart disease. What should I know about cancer screening? Many  types of cancers can be detected early and may often be prevented. Lung Cancer  You should be screened every year for lung cancer if: ? You are a current smoker who has smoked for at least 30 years. ? You are a former smoker who has quit within the past 15 years.  Talk to your health care provider about your screening options, when you should start screening, and how often you should be screened. Colorectal Cancer  Routine colorectal cancer screening usually begins at 37 years of age and should be repeated every 5-10 years until you are 37 years old. You may need to be screened more often if early forms of precancerous polyps or small growths are found. Your health care provider may recommend screening at an earlier age if you have risk factors for colon cancer.  Your health care provider may recommend using home test kits to check for hidden blood in the stool.  A small camera at the end of a tube can be used to examine your colon (sigmoidoscopy or colonoscopy). This checks for the earliest forms of colorectal cancer. Prostate and Testicular Cancer  Depending on your age and overall health, your health care provider may do certain tests to screen for prostate and testicular cancer.  Talk to your health care provider about any symptoms or concerns you have about testicular or prostate cancer. Skin Cancer  Check your skin from head to toe regularly.  Tell your health care provider about any new moles or changes in moles, especially if: ?  There is a change in a mole's size, shape, or color. ? You have a mole that is larger than a pencil eraser.  Always use sunscreen. Apply sunscreen liberally and repeat throughout the day.  Protect yourself by wearing long sleeves, pants, a wide-brimmed hat, and sunglasses when outside. What should I know about heart disease, diabetes, and high blood pressure?  If you are 8-25 years of age, have your blood pressure checked every 3-5 years. If you are  75 years of age or older, have your blood pressure checked every year. You should have your blood pressure measured twice-once when you are at a hospital or clinic, and once when you are not at a hospital or clinic. Record the average of the two measurements. To check your blood pressure when you are not at a hospital or clinic, you can use: ? An automated blood pressure machine at a pharmacy. ? A home blood pressure monitor.  Talk to your health care provider about your target blood pressure.  If you are between 61-3 years old, ask your health care provider if you should take aspirin to prevent heart disease.  Have regular diabetes screenings by checking your fasting blood sugar level. ? If you are at a normal weight and have a low risk for diabetes, have this test once every three years after the age of 76. ? If you are overweight and have a high risk for diabetes, consider being tested at a younger age or more often.  A one-time screening for abdominal aortic aneurysm (AAA) by ultrasound is recommended for men aged 65-75 years who are current or former smokers. What should I know about preventing infection? Hepatitis B If you have a higher risk for hepatitis B, you should be screened for this virus. Talk with your health care provider to find out if you are at risk for hepatitis B infection. Hepatitis C Blood testing is recommended for:  Everyone born from [birth year] through 1964.  Anyone with known risk factors for hepatitis C. Sexually Transmitted Diseases (STDs)  You should be screened each year for STDs including gonorrhea and chlamydia if: ? You are sexually active and are younger than 37 years of age. ? You are older than 37 years of age and your health care provider tells you that you are at risk for this type of infection. ? Your sexual activity has changed since you were last screened and you are at an increased risk for chlamydia or gonorrhea. Ask your health care provider if you  are at risk.  Talk with your health care provider about whether you are at high risk of being infected with HIV. Your health care provider may recommend a prescription medicine to help prevent HIV infection. What else can I do?  Schedule regular health, dental, and eye exams.  Stay current with your vaccines (immunizations).  Do not use any tobacco products, such as cigarettes, chewing tobacco, and e-cigarettes. If you need help quitting, ask your health care provider.  Limit alcohol intake to no more than 2 drinks per day. One drink equals 12 ounces of beer, 5 ounces of wine, or 1 ounces of hard liquor.  Do not use street drugs.  Do not share needles.  Ask your health care provider for help if you need support or information about quitting drugs.  Tell your health care provider if you often feel depressed.  Tell your health care provider if you have ever been abused or do not feel safe at home.  This information is not intended to replace advice given to you by your health care provider. Make sure you discuss any questions you have with your health care provider. Document Released: 04/16/2008 Document Revised: 06/17/2016 Document Reviewed: 07/23/2015 Elsevier Interactive Patient Education  2019 [employer].

## 2019-01-13 NOTE — Assessment & Plan Note (Signed)
Describes sensation of throat irritation at times at night, benign exam today. Not consistent with OSA. ESS = 6. ?GERD - suggested he monitor foods at night that can precede symptoms.

## 2019-01-13 NOTE — Assessment & Plan Note (Signed)
propecia refilled. Tolerating well.

## 2019-01-13 NOTE — Assessment & Plan Note (Signed)
Reviewed with patient. Update CBC.

## 2019-01-14 ENCOUNTER — Telehealth: Payer: Self-pay | Admitting: Family Medicine

## 2019-01-14 LAB — C. TRACHOMATIS/N. GONORRHOEAE RNA
C. trachomatis RNA, TMA: NOT DETECTED
N. gonorrhoeae RNA, TMA: NOT DETECTED

## 2019-01-14 NOTE — Telephone Encounter (Signed)
plz phone in finasteride due to E prescribing error.

## 2019-01-16 LAB — RPR: RPR Ser Ql: NONREACTIVE

## 2019-01-16 LAB — HIV ANTIBODY (ROUTINE TESTING W REFLEX): HIV: NONREACTIVE

## 2019-01-16 NOTE — Telephone Encounter (Signed)
Refill left on vm at pharmacy.  

## 2019-09-12 ENCOUNTER — Encounter: Payer: Self-pay | Admitting: Family Medicine

## 2019-09-12 ENCOUNTER — Telehealth (INDEPENDENT_AMBULATORY_CARE_PROVIDER_SITE_OTHER): Payer: 59 | Admitting: Family Medicine

## 2019-09-12 DIAGNOSIS — R059 Cough, unspecified: Secondary | ICD-10-CM | POA: Insufficient documentation

## 2019-09-12 DIAGNOSIS — R05 Cough: Secondary | ICD-10-CM | POA: Diagnosis not present

## 2019-09-12 NOTE — Assessment & Plan Note (Addendum)
Dry cough of 1 + month duration worse at night time. No allergic symptoms. Doubt infectious. Anticipate GERD related. Reviewed with patient, reviewed diet changes to help control reflux. Suggested 2 wk course of prilosec or nexium OTC strength, update me over MyChart with effect.

## 2019-09-12 NOTE — Progress Notes (Signed)
Virtual visit completed through [place]. Due to national recommendations of social distancing due to COVID-19, a virtual visit is felt to be most appropriate for this patient at this time. Reviewed limitations of a virtual visit.   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    Pulse (!) 58   Temp (!) 97.3 F (36.3 C)   Ht 6\' 4"  (1.93 m)   Wt 240 lb (108.9 kg)   BMI 29.21 kg/m    CC: cough Subjective:    Patient ID: Anthony Martin, male    DOB: 03/04/1981, 38 y.o.   MRN: [id number]  HPI: Anthony Martin is a 38 y.o. male presenting on 09/12/2019 for Cough (C/o dry cough at night, sometimes resolved with a drink of water. Occasionally coughs during the day. Started about 1 mo ago. Tried Delsym, barely helpful. )   1 mo h/o dry nagging cough present mainly at night time. Cough can wake him up in the middle of the night with coughing fit for 30-45 sec then goes away, drinking water helps improve symptoms. Treated with delsym with limited benefit. Some improvement noted over the past week.    Denies significant GERD. He does like spicy foods. Drinks alcohol and caffeine regularly.  Denies significant allergic rhinitis.  No asthma history No sick contacts at home. Quit smoking 10/2018 - even quit E-cigs/vaping earlier this year.  Normal exercising without cough.   Denies ST, malaise, PNdrainage.      Relevant past medical, surgical, family and social history reviewed and updated as indicated. Interim medical history since our last visit reviewed. Allergies and medications reviewed and updated. Outpatient Medications Prior to Visit  Medication Sig Dispense Refill  . finasteride (PROSCAR) 5 MG tablet Take 1/2 (one-half) tablet by mouth once daily 45 tablet 3   No facility-administered medications prior to visit.      Per HPI  unless specifically indicated in ROS section below Review of Systems Objective:    Pulse (!) 58   Temp (!) 97.3 F (36.3 C)   Ht 6\' 4"  (1.93 m)   Wt 240 lb (108.9 kg)   BMI 29.21 kg/m   Wt Readings from Last 3 Encounters:  09/12/19 240 lb (108.9 kg)  01/13/19 234 lb 6 oz (106.3 kg)  04/01/18 230 lb 12 oz (104.7 kg)     Physical exam: Gen: alert, NAD, not ill appearing Pulm: speaks in complete sentences without increased work of breathing Psych: normal mood, normal thought content      Assessment & Plan:   Problem List Items Addressed This Visit    Cough    Dry cough of 1 + month duration worse at night time. No allergic symptoms. Doubt infectious. Anticipate GERD related. Reviewed with patient, reviewed diet changes to help control reflux. Suggested 2 wk course of prilosec or nexium OTC strength, update me over MyChart with effect.           No orders of the defined types were placed in this encounter.  No orders of the defined types were placed in this encounter.   I discussed the assessment and treatment plan with the patient. The patient was provided an opportunity to ask questions and all were answered. The patient agreed with the plan and demonstrated an understanding of the instructions. The patient was advised to call back or seek an in-person evaluation if  the symptoms worsen or if the condition fails to improve as anticipated.  Follow up plan: Return if symptoms worsen or fail to improve.  Ria Bush, MD

## 2019-10-05 ENCOUNTER — Encounter: Payer: Self-pay | Admitting: Family Medicine

## 2019-10-05 NOTE — Telephone Encounter (Signed)
Spoke with pt asking about any sxs.  States he has no sxs (ie, fever, SOB, body aches, ST, etc.)  The cough he has had for well over a month and Dr. Darnell Level is aware of it.  Pls advise.

## 2019-10-06 MED ORDER — OMEPRAZOLE 40 MG PO CPDR: 40.0000 mg | DELAYED_RELEASE_CAPSULE | Freq: Every day | ORAL | 3 refills | Status: DC

## 2019-11-18 ENCOUNTER — Encounter: Payer: Self-pay | Admitting: Family Medicine

## 2020-02-21 ENCOUNTER — Encounter: Payer: Self-pay | Admitting: Family Medicine

## 2020-02-21 MED ORDER — FINASTERIDE 5 MG PO TABS: ORAL_TABLET | ORAL | 0 refills | Status: DC

## 2020-02-21 NOTE — Telephone Encounter (Signed)
E-scribed refill.  Plz schedule CPE and lab visits. 

## 2020-03-28 ENCOUNTER — Other Ambulatory Visit: Payer: Self-pay

## 2020-03-28 ENCOUNTER — Ambulatory Visit: Payer: 59 | Admitting: Family Medicine

## 2020-03-28 ENCOUNTER — Encounter: Payer: Self-pay | Admitting: Family Medicine

## 2020-03-28 ENCOUNTER — Ambulatory Visit (INDEPENDENT_AMBULATORY_CARE_PROVIDER_SITE_OTHER): Payer: 59 | Admitting: Family Medicine

## 2020-03-28 VITALS — BP 108/78 | HR 77 | Temp 98.2°F | Ht 76.0 in | Wt 234.0 lb

## 2020-03-28 DIAGNOSIS — K219 Gastro-esophageal reflux disease without esophagitis: Secondary | ICD-10-CM | POA: Diagnosis not present

## 2020-03-28 DIAGNOSIS — R059 Cough, unspecified: Secondary | ICD-10-CM

## 2020-03-28 DIAGNOSIS — R05 Cough: Secondary | ICD-10-CM | POA: Diagnosis not present

## 2020-03-28 MED ORDER — OMEPRAZOLE 20 MG PO CPDR: 20.0000 mg | DELAYED_RELEASE_CAPSULE | Freq: Every day | ORAL | 6 refills | Status: DC

## 2020-03-28 NOTE — Patient Instructions (Addendum)
GERD precautions: Head of bed elevated. Avoidance of citrus, fatty foods, chocolate, peppermint, and excessive alcohol, along with sodas, orange juice (acidic drinks) At least a few hours between dinner and bed, minimize naps after eating.   May continue omeprazole 40mg  course for 1-2 weeks for acute flare then transition to omeprazole 20mg  before breakfast or lunch and/or pepcid at night time.  Omperazole 20mg  sent to pharmacy.  If ongoing trouble, let me know to check for H pylori. If normal and still with breakthrough GERD, we could have you see GI.   Gastroesophageal Reflux Disease, Adult Gastroesophageal reflux (GER) happens when acid from the stomach flows up into the tube that connects the mouth and the stomach (esophagus). Normally, food travels down the esophagus and stays in the stomach to be digested. With GER, food and stomach acid sometimes move back up into the esophagus. You may have a disease called gastroesophageal reflux disease (GERD) if the reflux:  Happens often.  Causes frequent or very bad symptoms.  Causes problems such as damage to the esophagus. When this happens, the esophagus becomes sore and swollen (inflamed). Over time, GERD can make small holes (ulcers) in the lining of the esophagus. What are the causes? This condition is caused by a problem with the muscle between the esophagus and the stomach. When this muscle is weak or not normal, it does not close properly to keep food and acid from coming back up from the stomach. The muscle can be weak because of:  Tobacco use.  Pregnancy.  Having a certain type of hernia (hiatal hernia).  Alcohol use.  Certain foods and drinks, such as coffee, chocolate, onions, and peppermint. What increases the risk? You are more likely to develop this condition if you:  Are overweight.  Have a disease that affects your connective tissue.  Use NSAID medicines. What are the signs or symptoms? Symptoms of this condition  include:  Heartburn.  Difficult or painful swallowing.  The feeling of having a lump in the throat.  A bitter taste in the mouth.  Bad breath.  Having a lot of saliva.  Having an upset or bloated stomach.  Belching.  Chest pain. Different conditions can cause chest pain. Make sure you see your doctor if you have chest pain.  Shortness of breath or noisy breathing (wheezing).  Ongoing (chronic) cough or a cough at night.  Wearing away of the surface of teeth (tooth enamel).  Weight loss. How is this treated? Treatment will depend on how bad your symptoms are. Your doctor may suggest:  Changes to your diet.  Medicine.  Surgery. Follow these instructions at home: Eating and drinking   Follow a diet as told by your doctor. You may need to avoid foods and drinks such as: ? Coffee and tea (with or without caffeine). ? Drinks that contain alcohol. ? Energy drinks and sports drinks. ? Bubbly (carbonated) drinks or sodas. ? Chocolate and cocoa. ? Peppermint and mint flavorings. ? Garlic and onions. ? Horseradish. ? Spicy and acidic foods. These include peppers, chili powder, curry powder, vinegar, hot sauces, and BBQ sauce. ? Citrus fruit juices and citrus fruits, such as oranges, lemons, and limes. ? Tomato-based foods. These include red sauce, chili, salsa, and pizza with red sauce. ? Fried and fatty foods. These include donuts, french fries, potato chips, and high-fat dressings. ? High-fat meats. These include hot dogs, rib eye steak, sausage, ham, and bacon. ? High-fat dairy items, such as whole milk, butter, and cream cheese.  Eat small meals often. Avoid eating large meals.  Avoid drinking large amounts of liquid with your meals.  Avoid eating meals during the 2-3 hours before bedtime.  Avoid lying down right after you eat.  Do not exercise right after you eat. Lifestyle   Do not use any products that contain nicotine or tobacco. These include  cigarettes, e-cigarettes, and chewing tobacco. If you need help quitting, ask your doctor.  Try to lower your stress. If you need help doing this, ask your doctor.  If you are overweight, lose an amount of weight that is healthy for you. Ask your doctor about a safe weight loss goal. General instructions  Pay attention to any changes in your symptoms.  Take over-the-counter and prescription medicines only as told by your doctor. Do not take aspirin, ibuprofen, or other NSAIDs unless your doctor says it is okay.  Wear loose clothes. Do not wear anything tight around your waist.  Raise (elevate) the head of your bed about 6 inches (15 cm).  Avoid bending over if this makes your symptoms worse.  Keep all follow-up visits as told by your doctor. This is important. Contact a doctor if:  You have new symptoms.  You lose weight and you do not know why.  You have trouble swallowing or it hurts to swallow.  You have wheezing or a cough that keeps happening.  Your symptoms do not get better with treatment.  You have a hoarse voice. Get help right away if:  You have pain in your arms, neck, jaw, teeth, or back.  You feel sweaty, dizzy, or light-headed.  You have chest pain or shortness of breath.  You throw up (vomit) and your throw-up looks like blood or coffee grounds.  You pass out (faint).  Your poop (stool) is bloody or black.  You cannot swallow, drink, or eat. Summary  If a person has gastroesophageal reflux disease (GERD), food and stomach acid move back up into the esophagus and cause symptoms or problems such as damage to the esophagus.  Treatment will depend on how bad your symptoms are.  Follow a diet as told by your doctor.  Take all medicines only as told by your doctor. This information is not intended to replace advice given to you by your health care provider. Make sure you discuss any questions you have with your health care provider. Document Revised:  04/27/2018 Document Reviewed: 04/27/2018 Elsevier Patient Education  2020 [employer].

## 2020-03-28 NOTE — Progress Notes (Signed)
This visit was conducted in person.  BP 108/78 (BP Location: Left Arm, Patient Position: Sitting, Cuff Size: Large)   Pulse 77   Temp 98.2 F (36.8 C) (Temporal)   Ht 6\' 4"  (1.93 m)   Wt 234 lb (106.1 kg)   SpO2 96%   BMI 28.48 kg/m    CC: cough Subjective:    Patient ID: , male    DOB: 1981/01/16, 39 y.o.   MRN: [id number]  HPI: Anthony Martin is a 39 y.o. male presenting on 03/28/2020 for Cough (C/o cough worse at night even with omeprazole.  Thinks 40 mg is too strong.  Also taking Delsym to help. )   See prior note for details.  Nagging dry cough present for several months, thought hidden GERD - improved on omeprazole 40mg  daily - we transitioned to QOD dosing - he felt symptoms returning when he tried weaning off med (throat clearing, nagging cough returning). He feels 40mg  dose may be too strong - feels bloated feeling after meals when taking.   Cough could wake him up with fits, water helps symptoms. Lost voice this past week, associated with coughing fits. Worse cough with laying supine. Has started taking delsym. Feels globus sensation with swallowing. Currently taking omeprazole 40mg  daily for the past week.   Denies dysphagia, chest or epigastric abdominal pain. No nausea/vomiting or unexpected weight loss.   Fmhx omeprazole use. No fmhx PUD or H pylori.   Drinks 1-5 cups coffee in am  Alcohol - can drink significant alcohol (hard liquor) - more 2 wks ago  Diet - does try to limit spicy foods, acidic foods (although he likes this). No chocolate or mints. No smoking   No significant allergic rhinitis history.   COVID vaccine - hasn't done yet.      Relevant past medical, surgical, family and social history reviewed and updated as indicated. Interim medical history since our last visit reviewed. Allergies and medications reviewed and updated. Outpatient Medications Prior to Visit  Medication Sig Dispense Refill  . finasteride  (PROSCAR) 5 MG tablet Take 1/2 (one-half) tablet by mouth once daily 45 tablet 0  . omeprazole (PRILOSEC) 40 MG capsule Take 1 capsule (40 mg total) by mouth daily. For 3 weeks then as needed 30 capsule 3   No facility-administered medications prior to visit.     Per HPI unless specifically indicated in ROS section below Review of Systems Objective:  BP 108/78 (BP Location: Left Arm, Patient Position: Sitting, Cuff Size: Large)   Pulse 77   Temp 98.2 F (36.8 C) (Temporal)   Ht 6\' 4"  (1.93 m)   Wt 234 lb (106.1 kg)   SpO2 96%   BMI 28.48 kg/m   Wt Readings from Last 3 Encounters:  03/28/20 234 lb (106.1 kg)  09/12/19 240 lb (108.9 kg)  01/13/19 234 lb 6 oz (106.3 kg)      Physical Exam Vitals and nursing note reviewed.  Constitutional:      Appearance: Normal appearance. He is not ill-appearing.  HENT:     Mouth/Throat:     Mouth: Mucous membranes are moist.     Pharynx: Oropharynx is clear. No oropharyngeal exudate or posterior oropharyngeal erythema.  Eyes:     Extraocular Movements: Extraocular movements intact.     Conjunctiva/sclera: Conjunctivae normal.     Pupils: Pupils are equal, round, and reactive to light.  Neck:     Thyroid: No thyromegaly or thyroid tenderness.  Cardiovascular:  Rate and Rhythm: Normal rate and regular rhythm.     Pulses: Normal pulses.     Heart sounds: Normal heart sounds. No murmur.  Pulmonary:     Effort: Pulmonary effort is normal. No respiratory distress.     Breath sounds: Normal breath sounds. No wheezing, rhonchi or rales.  Abdominal:     General: Abdomen is flat. Bowel sounds are normal. There is no distension.     Palpations: Abdomen is soft. There is no mass.     Tenderness: There is no abdominal tenderness. There is no guarding or rebound.     Hernia: No hernia is present.  Neurological:     Mental Status: He is alert.  Psychiatric:        Mood and Affect: Mood normal.        Behavior: Behavior normal.         Assessment & Plan:  This visit occurred during the SARS-CoV-2 public health emergency.  Safety protocols were in place, including screening questions prior to the visit, additional usage of staff PPE, and extensive cleaning of exam room while observing appropriate contact time as indicated for disinfecting solutions.  Discussed COVID vaccine, questions answered.  Problem List Items Addressed This Visit    GERD (gastroesophageal reflux disease) - Primary    Anticipate large portion of cough related to hidden reflux/laryngopharyngeal reflux given symptoms temporally related to increased hard liquor intake at recent wedding. Reviewed dietary precautions to treat GERD as per instructions - he could back down on alcohol and caffeine.  No red flags.  rec continue Rx strength omeprazole 40mg  for 2 wks until acute flare has resolved then transition to omeprazole 20mg  daily (Rx sent in case more affordable).  Discussed checking H pylori or GI referral to eval for other cause (HH, stricture, EoE, etc) if symptoms not resolved with above.       Relevant Medications   omeprazole (PRILOSEC) 20 MG capsule   Cough    Anticipate cough related to hidden reflux. See above.           Meds ordered this encounter  Medications  . omeprazole (PRILOSEC) 20 MG capsule    Sig: Take 1 capsule (20 mg total) by mouth daily.    Dispense:  30 capsule    Refill:  6   No orders of the defined types were placed in this encounter.   Patient instructions: GERD precautions: Head of bed elevated. Avoidance of citrus, fatty foods, chocolate, peppermint, and excessive alcohol, along with sodas, orange juice (acidic drinks) At least a few hours between dinner and bed, minimize naps after eating.   May continue omeprazole 40mg  course for 1-2 weeks for acute flare then transition to omeprazole 20mg  before breakfast or lunch and/or pepcid at night time.  Omperazole 20mg  sent to pharmacy.  If ongoing trouble, let me know  to check for H pylori. If normal and still with breakthrough GERD, we could have you see GI.   Follow up plan: Return if symptoms worsen or fail to improve.  Ria Bush, MD

## 2020-03-30 NOTE — Assessment & Plan Note (Signed)
Anticipate large portion of cough related to hidden reflux/laryngopharyngeal reflux given symptoms temporally related to increased hard liquor intake at recent wedding. Reviewed dietary precautions to treat GERD as per instructions - he could back down on alcohol and caffeine.  No red flags.  rec continue Rx strength omeprazole 40mg  for 2 wks until acute flare has resolved then transition to omeprazole 20mg  daily (Rx sent in case more affordable).  Discussed checking H pylori or GI referral to eval for other cause (HH, stricture, EoE, etc) if symptoms not resolved with above.

## 2020-03-30 NOTE — Assessment & Plan Note (Signed)
Anticipate cough related to hidden reflux. See above.

## 2020-04-09 ENCOUNTER — Encounter: Payer: Self-pay | Admitting: Family Medicine

## 2020-04-09 ENCOUNTER — Ambulatory Visit (INDEPENDENT_AMBULATORY_CARE_PROVIDER_SITE_OTHER): Payer: 59 | Admitting: Family Medicine

## 2020-04-09 ENCOUNTER — Other Ambulatory Visit: Payer: Self-pay

## 2020-04-09 VITALS — BP 120/84 | HR 66 | Temp 98.0°F | Ht 76.0 in | Wt 236.3 lb

## 2020-04-09 DIAGNOSIS — E785 Hyperlipidemia, unspecified: Secondary | ICD-10-CM

## 2020-04-09 DIAGNOSIS — Z113 Encounter for screening for infections with a predominantly sexual mode of transmission: Secondary | ICD-10-CM | POA: Diagnosis not present

## 2020-04-09 DIAGNOSIS — Z0000 Encounter for general adult medical examination without abnormal findings: Secondary | ICD-10-CM

## 2020-04-09 DIAGNOSIS — K219 Gastro-esophageal reflux disease without esophagitis: Secondary | ICD-10-CM

## 2020-04-09 DIAGNOSIS — D709 Neutropenia, unspecified: Secondary | ICD-10-CM | POA: Diagnosis not present

## 2020-04-09 DIAGNOSIS — Z1159 Encounter for screening for other viral diseases: Secondary | ICD-10-CM

## 2020-04-09 DIAGNOSIS — R059 Cough, unspecified: Secondary | ICD-10-CM

## 2020-04-09 DIAGNOSIS — R05 Cough: Secondary | ICD-10-CM

## 2020-04-09 LAB — COMPREHENSIVE METABOLIC PANEL
ALT: 14 U/L (ref 0–53)
AST: 19 U/L (ref 0–37)
Albumin: 4.8 g/dL (ref 3.5–5.2)
Alkaline Phosphatase: 70 U/L (ref 39–117)
BUN: 14 mg/dL (ref 6–23)
CO2: 33 mEq/L — ABNORMAL HIGH (ref 19–32)
Calcium: 9.5 mg/dL (ref 8.4–10.5)
Chloride: 102 mEq/L (ref 96–112)
Creatinine, Ser: 0.94 mg/dL (ref 0.40–1.50)
GFR: 89.82 mL/min (ref >60.00)
Glucose, Bld: 96 mg/dL (ref 70–99)
Potassium: 4.3 mEq/L (ref 3.5–5.1)
Sodium: 138 mEq/L (ref 135–145)
Total Bilirubin: 0.9 mg/dL (ref 0.2–1.2)
Total Protein: 7.2 g/dL (ref 6.0–8.3)

## 2020-04-09 LAB — LIPID PANEL
Cholesterol: 221 mg/dL — ABNORMAL HIGH (ref 0–200)
HDL: 38.5 mg/dL — ABNORMAL LOW (ref >39.00)
LDL Cholesterol: 158 mg/dL — ABNORMAL HIGH (ref 0–99)
NonHDL: 182.01
Total CHOL/HDL Ratio: 6
Triglycerides: 120 mg/dL (ref 0.0–149.0)
VLDL: 24 mg/dL (ref 0.0–40.0)

## 2020-04-09 LAB — CBC WITH DIFFERENTIAL/PLATELET
Basophils Absolute: 0 10*3/uL (ref 0.0–0.1)
Basophils Relative: 0.6 % (ref 0.0–3.0)
Eosinophils Absolute: 0.1 10*3/uL (ref 0.0–0.7)
Eosinophils Relative: 2.9 % (ref 0.0–5.0)
HCT: 42.7 % (ref 39.0–52.0)
Hemoglobin: 14.7 g/dL (ref 13.0–17.0)
Lymphocytes Relative: 42.3 % (ref 12.0–46.0)
Lymphs Abs: 1.7 10*3/uL (ref 0.7–4.0)
MCHC: 34.4 g/dL (ref 30.0–36.0)
MCV: 90.3 fl (ref 78.0–100.0)
Monocytes Absolute: 0.3 10*3/uL (ref 0.1–1.0)
Monocytes Relative: 8.7 % (ref 3.0–12.0)
Neutro Abs: 1.8 10*3/uL (ref 1.4–7.7)
Neutrophils Relative %: 45.5 % (ref 43.0–77.0)
Platelets: 157 10*3/uL (ref 150.0–400.0)
RBC: 4.73 Mil/uL (ref 4.22–5.81)
RDW: 13.1 % (ref 11.5–15.5)
WBC: 4 10*3/uL (ref 4.0–10.5)

## 2020-04-09 NOTE — Assessment & Plan Note (Addendum)
GERD related - improves on omeprazole.

## 2020-04-09 NOTE — Assessment & Plan Note (Signed)
Chronic. No recurrent infections. Update CBC.

## 2020-04-09 NOTE — Assessment & Plan Note (Addendum)
Stable period on omeprazole 40mg  daily - once he finishes this course will start 20mg  dose and try PRN. Discussed H pylori testing if recurrence

## 2020-04-09 NOTE — Assessment & Plan Note (Signed)
Update FLP.  

## 2020-04-09 NOTE — Patient Instructions (Addendum)
Labs today Finish omeprazole 40mg  course then return to 20mg  as needed.  Let know if recurrent symptoms. Watch diet (alcohol, caffeine) to help control GERD symptoms.  You are doing well today Work on healthy diet and lifestyle   Health Maintenance, Male Adopting a healthy lifestyle and getting preventive care are important in promoting health and wellness. Ask your health care provider about:  The right schedule for you to have regular tests and exams.  Things you can do on your own to prevent diseases and keep yourself healthy. What should I know about diet, weight, and exercise? Eat a healthy diet   Eat a diet that includes plenty of vegetables, fruits, low-fat dairy products, and lean protein.  Do not eat a lot of foods that are high in solid fats, added sugars, or sodium. Maintain a healthy weight Body mass index (BMI) is a measurement that can be used to identify possible weight problems. It estimates body fat based on height and weight. Your health care provider can help determine your BMI and help you achieve or maintain a healthy weight. Get regular exercise Get regular exercise. This is one of the most important things you can do for your health. Most adults should:  Exercise for at least 150 minutes each week. The exercise should increase your heart rate and make you sweat (moderate-intensity exercise).  Do strengthening exercises at least twice a week. This is in addition to the moderate-intensity exercise.  Spend less time sitting. Even light physical activity can be beneficial. Watch cholesterol and blood lipids Have your blood tested for lipids and cholesterol at 38 years of age, then have this test every 5 years. You may need to have your cholesterol levels checked more often if:  Your lipid or cholesterol levels are high.  You are older than 38 years of age.  You are at high risk for heart disease. What should I know about cancer screening? Many types of  cancers can be detected early and may often be prevented. Depending on your health history and family history, you may need to have cancer screening at various ages. This may include screening for:  Colorectal cancer.  Prostate cancer.  Skin cancer.  Lung cancer. What should I know about heart disease, diabetes, and high blood pressure? Blood pressure and heart disease  High blood pressure causes heart disease and increases the risk of stroke. This is more likely to develop in people who have high blood pressure readings, are of African descent, or are overweight.  Talk with your health care provider about your target blood pressure readings.  Have your blood pressure checked: ? Every 3-5 years if you are 47-33 years of age. ? Every year if you are 38 years old or older.  If you are between the ages of 18 and 19 and are a current or former smoker, ask your health care provider if you should have a one-time screening for abdominal aortic aneurysm (AAA). Diabetes Have regular diabetes screenings. This checks your fasting blood sugar level. Have the screening done:  Once every three years after age 30 if you are at a normal weight and have a low risk for diabetes.  More often and at a younger age if you are overweight or have a high risk for diabetes. What should I know about preventing infection? Hepatitis B If you have a higher risk for hepatitis B, you should be screened for this virus. Talk with your health care provider to find out if  you are at risk for hepatitis B infection. Hepatitis C Blood testing is recommended for:  Everyone born from [birth year] through 1964.  Anyone with known risk factors for hepatitis C. Sexually transmitted infections (STIs)  You should be screened each year for STIs, including gonorrhea and chlamydia, if: ? You are sexually active and are younger than 38 years of age. ? You are older than 38 years of age and your health care provider tells you that you  are at risk for this type of infection. ? Your sexual activity has changed since you were last screened, and you are at increased risk for chlamydia or gonorrhea. Ask your health care provider if you are at risk.  Ask your health care provider about whether you are at high risk for HIV. Your health care provider may recommend a prescription medicine to help prevent HIV infection. If you choose to take medicine to prevent HIV, you should first get tested for HIV. You should then be tested every 3 months for as long as you are taking the medicine. Follow these instructions at home: Lifestyle  Do not use any products that contain nicotine or tobacco, such as cigarettes, e-cigarettes, and chewing tobacco. If you need help quitting, ask your health care provider.  Do not use street drugs.  Do not share needles.  Ask your health care provider for help if you need support or information about quitting drugs. Alcohol use  Do not drink alcohol if your health care provider tells you not to drink.  If you drink alcohol: ? Limit how much you have to 0-2 drinks a day. ? Be aware of how much alcohol is in your drink. In the [place] one drink equals one 12 oz bottle of beer (355 mL), one 5 oz glass of wine (148 mL), or one 1 oz glass of hard liquor (44 mL). General instructions  Schedule regular health, dental, and eye exams.  Stay current with your vaccines.  Tell your health care provider if: ? You often feel depressed. ? You have ever been abused or do not feel safe at home. Summary  Adopting a healthy lifestyle and getting preventive care are important in promoting health and wellness.  Follow your health care provider's instructions about healthy diet, exercising, and getting tested or screened for diseases.  Follow your health care provider's instructions on monitoring your cholesterol and blood pressure. This information is not intended to replace advice given to you by your health care  provider. Make sure you discuss any questions you have with your health care provider. Document Revised: 10/12/2018 Document Reviewed: 10/12/2018 Elsevier Patient Education  2020 [employer].

## 2020-04-09 NOTE — Assessment & Plan Note (Signed)
Preventative protocols reviewed and updated unless pt declined. Discussed healthy diet and lifestyle.  

## 2020-04-09 NOTE — Progress Notes (Signed)
This visit was conducted in person.  BP 120/84 (BP Location: Left Arm, Patient Position: Sitting, Cuff Size: Large)   Pulse 66   Temp 98 F (36.7 C) (Temporal)   Ht 6\' 4"  (1.93 m)   Wt 236 lb 5 oz (107.2 kg)   SpO2 99%   BMI 28.76 kg/m    CC: CPE Subjective:    Patient ID: , male    DOB: 12-Feb-1981, 39 y.o.   MRN: [id number]  HPI: Anthony Martin is a 39 y.o. male presenting on 04/09/2020 for Annual Exam   See prior note for details. On omeprazole 40mg  daily after GERD flare with benefit.   Preventative: Flu shot yearly  Tdap 2014  COVID vaccine - declines for now  Hep A series completed  Seat belt use discussed - doesn't wear regularly Sunscreen use discussed. No changing moles on skin.  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 Interim medical history since our last visit reviewed. Allergies and medications reviewed and updated. Outpatient Medications Prior to Visit  Medication Sig Dispense Refill  . finasteride (PROSCAR) 5 MG tablet Take 1/2 (one-half) tablet by mouth once daily 45 tablet 0  . omeprazole (PRILOSEC) 20 MG capsule Take 1 capsule (20 mg total) by mouth daily. 30 capsule 6   No facility-administered medications prior to visit.     Per HPI unless specifically indicated in ROS section below Review of Systems  Constitutional: Negative for activity change, appetite change, chills, fatigue, fever and unexpected weight change.  HENT: Negative for hearing loss.   Eyes: Negative for visual disturbance.  Respiratory:  Positive for cough (reflux related). Negative for chest tightness, shortness of breath and wheezing.   Cardiovascular: Negative for chest pain, palpitations and leg swelling.  Gastrointestinal: Positive for abdominal pain. Negative for abdominal distention, blood in stool, constipation, diarrhea, nausea and vomiting.  Genitourinary: Negative for difficulty urinating and hematuria.  Musculoskeletal: Negative for arthralgias, myalgias and neck pain.  Skin: Negative for rash.  Neurological: Negative for dizziness, seizures, syncope and headaches.  Hematological: Negative for adenopathy. Does not bruise/bleed easily.  Psychiatric/Behavioral: Negative for dysphoric mood. The patient is not nervous/anxious.    Objective:  BP 120/84 (BP Location: Left Arm, Patient Position: Sitting, Cuff Size: Large)   Pulse 66   Temp 98 F (36.7 C) (Temporal)   Ht 6\' 4"  (1.93 m)   Wt 236 lb 5 oz (107.2 kg)   SpO2 99%   BMI 28.76 kg/m   Wt Readings from Last 3 Encounters:  04/09/20 236 lb 5 oz (107.2 kg)  03/28/20 234 lb (106.1 kg)  09/12/19 240 lb (108.9 kg)      Physical Exam Vitals and nursing note reviewed.  Constitutional:      General: He is not in acute distress.    Appearance: Normal appearance. He is well-developed. He is not ill-appearing.  HENT:     Head: Normocephalic and atraumatic.     Right Ear: Hearing, tympanic membrane, ear canal and external ear normal.     Left Ear:  Hearing, tympanic membrane, ear canal and external ear normal.  Eyes:     General: No scleral icterus.    Extraocular Movements: Extraocular movements intact.     Conjunctiva/sclera: Conjunctivae normal.     Pupils: Pupils are equal, round, and reactive to light.  Cardiovascular:     Rate and Rhythm: Normal rate and regular rhythm.     Pulses: Normal pulses.          Radial pulses are 2+ on the right side and 2+ on the left side.     Heart sounds: Normal heart sounds. No murmur.  Pulmonary:     Effort: Pulmonary  effort is normal. No respiratory distress.     Breath sounds: Normal breath sounds. No wheezing, rhonchi or rales.  Abdominal:     General: Abdomen is flat. Bowel sounds are normal. There is no distension.     Palpations: Abdomen is soft. There is no mass.     Tenderness: There is no abdominal tenderness. There is no guarding or rebound.     Hernia: No hernia is present.  Musculoskeletal:        General: Normal range of motion.     Cervical back: Normal range of motion and neck supple.     Right lower leg: No edema.     Left lower leg: No edema.  Lymphadenopathy:     Cervical: No cervical adenopathy.  Skin:    General: Skin is warm and dry.     Findings: No rash.  Neurological:     General: No focal deficit present.     Mental Status: He is alert and oriented to person, place, and time.     Comments: CN grossly intact, station and gait intact  Psychiatric:        Mood and Affect: Mood normal.        Behavior: Behavior normal.        Thought Content: Thought content normal.        Judgment: Judgment normal.       Results for orders placed or performed in visit on 01/13/19  C. trachomatis/N. gonorrhoeae RNA   Specimen: Urine  Result Value Ref Range   C. trachomatis RNA, TMA NOT DETECTED NOT DETECT   N. gonorrhoeae RNA, TMA NOT DETECTED NOT DETECT  Lipid panel  Result Value Ref Range   Cholesterol 189 0 - 200 mg/dL   Triglycerides 77.8 0.0 - 149.0 mg/dL   HDL 24.23 >53.61 mg/dL   VLDL 44.3 0.0 - 15.4 mg/dL   LDL Cholesterol 008 (H) 0 - 99 mg/dL   Total CHOL/HDL Ratio 4    NonHDL 141.47   CBC with Differential/Platelet  Result Value Ref Range   WBC 3.4 (L) 4.0 - 10.5 K/uL   RBC 4.90 4.22 - 5.81 Mil/uL   Hemoglobin 15.1 13.0 - 17.0 g/dL   HCT 67.6 19.5 - 09.3 %   MCV 90.0 78.0 - 100.0 fl   MCHC 34.3 30.0 - 36.0 g/dL   RDW 26.7 12.4 - 58.0 %   Platelets 160.0 150.0 - 400.0 K/uL   Neutrophils Relative % 38.8 (L) 43.0 - 77.0 %   Lymphocytes Relative 44.0 12.0 - 46.0 %    Monocytes Relative 10.1 3.0 - 12.0 %   Eosinophils Relative 6.0 (H) 0.0 - 5.0 %   Basophils Relative 1.1 0.0 - 3.0 %   Neutro Abs 1.3 (L) 1.4 - 7.7 K/uL   Lymphs Abs 1.5 0.7 - 4.0 K/uL   Monocytes Absolute  0.3 0.1 - 1.0 K/uL   Eosinophils Absolute 0.2 0.0 - 0.7 K/uL   Basophils Absolute 0.0 0.0 - 0.1 K/uL  Basic metabolic panel  Result Value Ref Range   Sodium 139 135 - 145 mEq/L   Potassium 4.2 3.5 - 5.1 mEq/L   Chloride 102 96 - 112 mEq/L   CO2 30 19 - 32 mEq/L   Glucose, Bld 82 70 - 99 mg/dL   BUN 15 6 - 23 mg/dL   Creatinine, Ser 1.01 0.40 - 1.50 mg/dL   Calcium 9.7 8.4 - 10.5 mg/dL   GFR 83.24 >60.00 mL/min  HIV Antibody (routine testing w rflx)  Result Value Ref Range   HIV 1&2 Ab, 4th Generation NON-REACTIVE NON-REACTI  RPR  Result Value Ref Range   RPR Ser Ql NON-REACTIVE NON-REACTI   Assessment & Plan:  This visit occurred during the SARS-CoV-2 public health emergency.  Safety protocols were in place, including screening questions prior to the visit, additional usage of staff PPE, and extensive cleaning of exam room while observing appropriate contact time as indicated for disinfecting solutions.   Problem List Items Addressed This Visit    Neutropenia ([place])    Chronic. No recurrent infections. Update CBC.       Relevant Orders   CBC with Differential/Platelet   Health maintenance examination - Primary    Preventative protocols reviewed and updated unless pt declined. Discussed healthy diet and lifestyle.       GERD (gastroesophageal reflux disease)    Stable period on omeprazole 40mg  daily - once he finishes this course will start 20mg  dose and try PRN. Discussed H pylori testing if recurrence      Dyslipidemia    Update FLP.        Relevant Orders   Lipid panel   Comprehensive metabolic panel   Cough    GERD related - improves on omeprazole.        Other Visit Diagnoses    Screen for STD (sexually transmitted disease)       Relevant Orders    HIV Antibody (routine testing w rflx)   RPR   Need for hepatitis C screening test       Relevant Orders   Hepatitis C antibody       No orders of the defined types were placed in this encounter.  Orders Placed This Encounter  Procedures  . Lipid panel  . Comprehensive metabolic panel  . CBC with Differential/Platelet  . HIV Antibody (routine testing w rflx)  . RPR  . Hepatitis C antibody    Patient instructions: Labs today Finish omeprazole 40mg  course then return to 20mg  as needed.  Let us know if recurrent symptoms. Watch diet (alcohol, caffeine) to help control GERD symptoms.  You are doing well today Work on healthy diet and lifestyle   Follow up plan: Return in about 1 year (around 04/09/2021) for annual exam, prior fasting for blood work.  Ria Bush, MD

## 2020-04-10 LAB — HIV ANTIBODY (ROUTINE TESTING W REFLEX): HIV 1&2 Ab, 4th Generation: NONREACTIVE

## 2020-04-10 LAB — HEPATITIS C ANTIBODY
Hepatitis C Ab: NONREACTIVE
SIGNAL TO CUT-OFF: 0.01 (ref <1.00)

## 2020-04-10 LAB — RPR: RPR Ser Ql: NONREACTIVE

## 2020-05-31 ENCOUNTER — Encounter: Payer: Self-pay | Admitting: Family Medicine

## 2020-06-03 MED ORDER — FINASTERIDE 5 MG PO TABS: ORAL_TABLET | ORAL | 3 refills | Status: DC

## 2020-06-03 NOTE — Telephone Encounter (Signed)
E-scribed finasteride refill.  Fwd to Dr. Reece Agar to address increase in omeprazole request.

## 2020-06-06 MED ORDER — OMEPRAZOLE 40 MG PO CPDR: 40.0000 mg | DELAYED_RELEASE_CAPSULE | Freq: Every day | ORAL | 3 refills | Status: DC

## 2020-06-06 NOTE — Addendum Note (Signed)
Addended by: Eustaquio Boyden on: 06/06/2020 03:56 PM   Modules accepted: Orders

## 2020-08-05 ENCOUNTER — Encounter: Payer: Self-pay | Admitting: Family Medicine

## 2020-08-05 DIAGNOSIS — K219 Gastro-esophageal reflux disease without esophagitis: Secondary | ICD-10-CM

## 2020-08-09 NOTE — Telephone Encounter (Signed)
plz send last 2 office notes.

## 2021-06-27 ENCOUNTER — Other Ambulatory Visit: Payer: Self-pay | Admitting: Family Medicine

## 2021-09-11 ENCOUNTER — Telehealth: Payer: Self-pay | Admitting: Family Medicine

## 2021-09-11 NOTE — Telephone Encounter (Signed)
Patient has called the office stating he is changing his insurance company and he will be needing the provider NPI number.  Please advise.

## 2021-09-11 NOTE — Telephone Encounter (Signed)
Ok to give patient NPI number.   Please call patient and let him know the number is :  [phone]  Thanks.

## 2021-12-27 ENCOUNTER — Other Ambulatory Visit: Payer: Self-pay | Admitting: Family Medicine

## 2021-12-27 DIAGNOSIS — E785 Hyperlipidemia, unspecified: Secondary | ICD-10-CM

## 2021-12-27 DIAGNOSIS — D696 Thrombocytopenia, unspecified: Secondary | ICD-10-CM

## 2021-12-29 ENCOUNTER — Other Ambulatory Visit (INDEPENDENT_AMBULATORY_CARE_PROVIDER_SITE_OTHER): Payer: 59

## 2021-12-29 ENCOUNTER — Other Ambulatory Visit: Payer: Self-pay

## 2021-12-29 DIAGNOSIS — E785 Hyperlipidemia, unspecified: Secondary | ICD-10-CM | POA: Diagnosis not present

## 2021-12-29 DIAGNOSIS — D696 Thrombocytopenia, unspecified: Secondary | ICD-10-CM | POA: Diagnosis not present

## 2021-12-29 LAB — COMPREHENSIVE METABOLIC PANEL
ALT: 24 U/L (ref 0–53)
AST: 55 U/L — ABNORMAL HIGH (ref 0–37)
Albumin: 4.8 g/dL (ref 3.5–5.2)
Alkaline Phosphatase: 53 U/L (ref 39–117)
BUN: 14 mg/dL (ref 6–23)
CO2: 33 mEq/L — ABNORMAL HIGH (ref 19–32)
Calcium: 9.5 mg/dL (ref 8.4–10.5)
Chloride: 103 mEq/L (ref 96–112)
Creatinine, Ser: 1.12 mg/dL (ref 0.40–1.50)
GFR: 82.64 mL/min (ref >60.00)
Glucose, Bld: 100 mg/dL — ABNORMAL HIGH (ref 70–99)
Potassium: 4.1 mEq/L (ref 3.5–5.1)
Sodium: 141 mEq/L (ref 135–145)
Total Bilirubin: 1.1 mg/dL (ref 0.2–1.2)
Total Protein: 7.1 g/dL (ref 6.0–8.3)

## 2021-12-29 LAB — LIPID PANEL
Cholesterol: 246 mg/dL — ABNORMAL HIGH (ref 0–200)
HDL: 51.7 mg/dL (ref >39.00)
LDL Cholesterol: 171 mg/dL — ABNORMAL HIGH (ref 0–99)
NonHDL: 194.14
Total CHOL/HDL Ratio: 5
Triglycerides: 116 mg/dL (ref 0.0–149.0)
VLDL: 23.2 mg/dL (ref 0.0–40.0)

## 2021-12-29 LAB — CBC WITH DIFFERENTIAL/PLATELET
Basophils Absolute: 0 10*3/uL (ref 0.0–0.1)
Basophils Relative: 0.8 % (ref 0.0–3.0)
Eosinophils Absolute: 0.2 10*3/uL (ref 0.0–0.7)
Eosinophils Relative: 5.4 % — ABNORMAL HIGH (ref 0.0–5.0)
HCT: 43 % (ref 39.0–52.0)
Hemoglobin: 14.6 g/dL (ref 13.0–17.0)
Lymphocytes Relative: 51.2 % — ABNORMAL HIGH (ref 12.0–46.0)
Lymphs Abs: 2.1 10*3/uL (ref 0.7–4.0)
MCHC: 34 g/dL (ref 30.0–36.0)
MCV: 90.6 fl (ref 78.0–100.0)
Monocytes Absolute: 0.5 10*3/uL (ref 0.1–1.0)
Monocytes Relative: 11.2 % (ref 3.0–12.0)
Neutro Abs: 1.3 10*3/uL — ABNORMAL LOW (ref 1.4–7.7)
Neutrophils Relative %: 31.4 % — ABNORMAL LOW (ref 43.0–77.0)
Platelets: 146 10*3/uL — ABNORMAL LOW (ref 150.0–400.0)
RBC: 4.75 Mil/uL (ref 4.22–5.81)
RDW: 12.9 % (ref 11.5–15.5)
WBC: 4 10*3/uL (ref 4.0–10.5)

## 2022-01-04 ENCOUNTER — Encounter: Payer: Self-pay | Admitting: Family Medicine

## 2022-01-05 ENCOUNTER — Other Ambulatory Visit: Payer: Self-pay

## 2022-01-05 ENCOUNTER — Encounter: Payer: Self-pay | Admitting: Family Medicine

## 2022-01-05 ENCOUNTER — Ambulatory Visit (INDEPENDENT_AMBULATORY_CARE_PROVIDER_SITE_OTHER): Payer: 59 | Admitting: Family Medicine

## 2022-01-05 VITALS — BP 116/80 | HR 69 | Temp 97.6°F | Ht 76.0 in | Wt 236.2 lb

## 2022-01-05 DIAGNOSIS — E785 Hyperlipidemia, unspecified: Secondary | ICD-10-CM

## 2022-01-05 DIAGNOSIS — Z0000 Encounter for general adult medical examination without abnormal findings: Secondary | ICD-10-CM

## 2022-01-05 DIAGNOSIS — K219 Gastro-esophageal reflux disease without esophagitis: Secondary | ICD-10-CM

## 2022-01-05 DIAGNOSIS — D696 Thrombocytopenia, unspecified: Secondary | ICD-10-CM

## 2022-01-05 DIAGNOSIS — R7401 Elevation of levels of liver transaminase levels: Secondary | ICD-10-CM | POA: Insufficient documentation

## 2022-01-05 DIAGNOSIS — D709 Neutropenia, unspecified: Secondary | ICD-10-CM

## 2022-01-05 NOTE — Assessment & Plan Note (Signed)
Preventative protocols reviewed and updated unless pt declined. Discussed healthy diet and lifestyle.  

## 2022-01-05 NOTE — Assessment & Plan Note (Addendum)
New, alcohol related vs fatty liver related.  ?Discussed relation of alcohol use to liver health. Encouraged cutting down on alcohol.  ?

## 2022-01-05 NOTE — Assessment & Plan Note (Addendum)
Discussed diet choices to improve cholesterol levels. Low chol diet handout provided today.  ?The ASCVD Risk score (Arnett DK, et al., 2019) failed to calculate for the following reasons: ?  The 2019 ASCVD risk score is only valid for ages 47 to 14  ?

## 2022-01-05 NOTE — Assessment & Plan Note (Signed)
Stable period with elevated relative lymphocyte %. Will monitor this.  ?

## 2022-01-05 NOTE — Assessment & Plan Note (Signed)
Stable period off PPI. ?hidden reflux contributing to symptoms. Overall benign exam today.  ?

## 2022-01-05 NOTE — Progress Notes (Addendum)
Patient ID: Anthony Martin, male    DOB: August 11, 1981, 41 y.o.   MRN: [id number]  This visit was conducted in person.  BP 116/80    Pulse 69    Temp 97.6 F (36.4 C) (Temporal)    Ht 6\' 4"  (1.93 m)    Wt 236 lb 4 oz (107.2 kg)    SpO2 98%    BMI 28.76 kg/m    CC: CPE Subjective:   HPI: Anthony Martin is a 41 y.o. male presenting on 01/05/2022 for Annual Exam   Lives in [place].  Now off finasteride.  Cough has largely improved. No longer on omeprazole.  Notes some difficulty with laying supine due to globus sensation in throat - uses 2 pillows at night. He does snore. No dysphagia, nausea/vomiting, early satiety. Rarely episodes of regurgitation with belching.   Preventative: Flu shot - yearly, not this year Tdap 2014  Covid vaccine - moderna x2 05/2021  Hep A series completed  Seat belt use discussed  Sunscreen use discussed. 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 Interim medical history since our last visit reviewed. Allergies and medications reviewed and updated. Outpatient Medications Prior to Visit  Medication Sig Dispense Refill   finasteride (PROSCAR) 5 MG tablet TAKE 1/2 TABLET BY MOUTH ONCE DAILY 45 tablet 1   omeprazole (PRILOSEC) 40 MG capsule TAKE 1 CAPSULE BY MOUTH EVERY DAY 90 capsule 1   No facility-administered medications prior to visit.     Per HPI unless specifically indicated in ROS section below Review of Systems  Constitutional:  Negative for activity change, appetite change, chills,  fatigue, fever and unexpected weight change.  HENT:  Negative for hearing loss.   Eyes:  Negative for visual disturbance.  Respiratory:  Negative for cough, chest tightness, shortness of breath and wheezing.   Cardiovascular:  Negative for chest pain, palpitations and leg swelling.  Gastrointestinal:  Negative for abdominal distention, abdominal pain, blood in stool, constipation, diarrhea, nausea and vomiting.  Genitourinary:  Negative for difficulty urinating and hematuria.  Musculoskeletal:  Negative for arthralgias, myalgias and neck pain.  Skin:  Negative for rash.  Neurological:  Negative for dizziness, seizures, syncope and headaches.  Hematological:  Negative for adenopathy. Does not bruise/bleed easily.  Psychiatric/Behavioral:  Negative for dysphoric mood. The patient is not nervous/anxious.    Objective:  BP 116/80    Pulse 69    Temp 97.6 F (36.4 C) (Temporal)    Ht 6\' 4"  (1.93 m)    Wt 236 lb 4 oz (107.2 kg)    SpO2 98%    BMI 28.76 kg/m   Wt Readings from Last 3 Encounters:  01/05/22 236 lb 4 oz (107.2 kg)  04/09/20 236 lb 5 oz (107.2 kg)  03/28/20 234 lb (106.1 kg)      Physical Exam Vitals and nursing note reviewed.  Constitutional:      General: He is not in acute distress.    Appearance: Normal appearance. He is well-developed. He  is not ill-appearing.  HENT:     Head: Normocephalic and atraumatic.     Right Ear: Hearing, tympanic membrane, ear canal and external ear normal.     Left Ear: Hearing, tympanic membrane, ear canal and external ear normal.  Eyes:     General: No scleral icterus.    Extraocular Movements: Extraocular movements intact.     Conjunctiva/sclera: Conjunctivae normal.     Pupils: Pupils are equal, round, and reactive to light.  Neck:     Thyroid: No thyroid mass or thyromegaly.  Cardiovascular:     Rate and Rhythm: Normal rate and regular rhythm.     Pulses: Normal pulses.          Radial pulses are 2+ on the right side and 2+ on the  left side.     Heart sounds: Normal heart sounds. No murmur heard. Pulmonary:     Effort: Pulmonary effort is normal. No respiratory distress.     Breath sounds: Normal breath sounds. No wheezing, rhonchi or rales.  Abdominal:     General: Bowel sounds are normal. There is no distension.     Palpations: Abdomen is soft. There is no mass.     Tenderness: There is no abdominal tenderness. There is no guarding or rebound.     Hernia: No hernia is present.  Musculoskeletal:        General: Normal range of motion.     Cervical back: Normal range of motion and neck supple.     Right lower leg: No edema.     Left lower leg: No edema.  Lymphadenopathy:     Cervical: No cervical adenopathy.  Skin:    General: Skin is warm and dry.     Findings: No rash.  Neurological:     General: No focal deficit present.     Mental Status: He is alert and oriented to person, place, and time.  Psychiatric:        Mood and Affect: Mood normal.        Behavior: Behavior normal.        Thought Content: Thought content normal.        Judgment: Judgment normal.      Results for orders placed or performed in visit on 12/29/21  CBC with Differential/Platelet  Result Value Ref Range   WBC 4.0 4.0 - 10.5 K/uL   RBC 4.75 4.22 - 5.81 Mil/uL   Hemoglobin 14.6 13.0 - 17.0 g/dL   HCT 21.3 08.6 - 57.8 %   MCV 90.6 78.0 - 100.0 fl   MCHC 34.0 30.0 - 36.0 g/dL   RDW 46.9 62.9 - 52.8 %   Platelets 146.0 (L) 150.0 - 400.0 K/uL   Neutrophils Relative % 31.4 (L) 43.0 - 77.0 %   Lymphocytes Relative 51.2 Repeated and verified X2. (H) 12.0 - 46.0 %   Monocytes Relative 11.2 3.0 - 12.0 %   Eosinophils Relative 5.4 (H) 0.0 - 5.0 %   Basophils Relative 0.8 0.0 - 3.0 %   Neutro Abs 1.3 (L) 1.4 - 7.7 K/uL   Lymphs Abs 2.1 0.7 - 4.0 K/uL   Monocytes Absolute 0.5 0.1 - 1.0 K/uL   Eosinophils Absolute 0.2 0.0 - 0.7 K/uL   Basophils Absolute 0.0 0.0 - 0.1 K/uL  Comprehensive metabolic panel  Result Value Ref Range    Sodium 141 135 - 145 mEq/L   Potassium 4.1 3.5 - 5.1 mEq/L   Chloride 103 96 - 112 mEq/L   CO2  33 (H) 19 - 32 mEq/L   Glucose, Bld 100 (H) 70 - 99 mg/dL   BUN 14 6 - 23 mg/dL   Creatinine, Ser 1.75 0.40 - 1.50 mg/dL   Total Bilirubin 1.1 0.2 - 1.2 mg/dL   Alkaline Phosphatase 53 39 - 117 U/L   AST 55 (H) 0 - 37 U/L   ALT 24 0 - 53 U/L   Total Protein 7.1 6.0 - 8.3 g/dL   Albumin 4.8 3.5 - 5.2 g/dL   GFR 10.25 >85.27 mL/min   Calcium 9.5 8.4 - 10.5 mg/dL  Lipid panel  Result Value Ref Range   Cholesterol 246 (H) 0 - 200 mg/dL   Triglycerides 782.4 0.0 - 149.0 mg/dL   HDL 23.53 >61.44 mg/dL   VLDL 31.5 0.0 - 40.0 mg/dL   LDL Cholesterol 867 (H) 0 - 99 mg/dL   Total CHOL/HDL Ratio 5    NonHDL 194.14     Assessment & Plan:  This visit occurred during the SARS-CoV-2 public health emergency.  Safety protocols were in place, including screening questions prior to the visit, additional usage of staff PPE, and extensive cleaning of exam room while observing appropriate contact time as indicated for disinfecting solutions.   Problem List Items Addressed This Visit     Health maintenance examination - Primary (Chronic)    Preventative protocols reviewed and updated unless pt declined. Discussed healthy diet and lifestyle.       Neutropenia ([facility])    Stable period with elevated relative lymphocyte %. Will monitor this.       Thrombocytopenia ([facility])    Chronic, mild low plt ?ITP - will continue to monitor.       GERD (gastroesophageal reflux disease)    Stable period off PPI. ?hidden reflux contributing to symptoms. Overall benign exam today.       Dyslipidemia    Discussed diet choices to improve cholesterol levels. Low chol diet handout provided today.  The ASCVD Risk score (Arnett DK, et al., 2019) failed to calculate for the following reasons:   The 2019 ASCVD risk score is only valid for ages 48 to 59       Transaminitis    New, alcohol related vs fatty liver related.   Discussed relation of alcohol use to liver health. Encouraged cutting down on alcohol.         No orders of the defined types were placed in this encounter.  No orders of the defined types were placed in this encounter.   Patient instructions: You are doing well today Work on regular exercise routine.  Work on [employer] choices to help control LDL cholesterol levels.  Return as needed or in 1 year for next physical.   Follow up plan: Return in about 1 year (around 01/06/2023) for annual exam, prior fasting for blood work.  Eustaquio Boyden, MD

## 2022-01-05 NOTE — Patient Instructions (Signed)
You are doing well today ?Work on regular exercise routine.  ?Work on [employer] choices to help control LDL cholesterol levels.  ?Return as needed or in 1 year for next physical.  ? ?Health Maintenance, Male ?Adopting a healthy lifestyle and getting preventive care are important in promoting health and wellness. Ask your health care provider about: ?The right schedule for you to have regular tests and exams. ?Things you can do on your own to prevent diseases and keep yourself healthy. ?What should I know about diet, weight, and exercise? ?Eat a healthy diet ? ?Eat a diet that includes plenty of vegetables, fruits, low-fat dairy products, and lean protein. ?Do not eat a lot of foods that are high in solid fats, added sugars, or sodium. ?Maintain a healthy weight ?Body mass index (BMI) is a measurement that can be used to identify possible weight problems. It estimates body fat based on height and weight. Your health care provider can help determine your BMI and help you achieve or maintain a healthy weight. ?Get regular exercise ?Get regular exercise. This is one of the most important things you can do for your health. Most adults should: ?Exercise for at least 150 minutes each week. The exercise should increase your heart rate and make you sweat (moderate-intensity exercise). ?Do strengthening exercises at least twice a week. This is in addition to the moderate-intensity exercise. ?Spend less time sitting. Even light physical activity can be beneficial. ?Watch cholesterol and blood lipids ?Have your blood tested for lipids and cholesterol at 40 years of age, then have this test every 5 years. ?You may need to have your cholesterol levels checked more often if: ?Your lipid or cholesterol levels are high. ?You are older than 40 years of age. ?You are at high risk for heart disease. ?What should I know about cancer screening? ?Many types of cancers can be detected early and may often be prevented. Depending on your  health history and family history, you may need to have cancer screening at various ages. This may include screening for: ?Colorectal cancer. ?Prostate cancer. ?Skin cancer. ?Lung cancer. ?What should I know about heart disease, diabetes, and high blood pressure? ?Blood pressure and heart disease ?High blood pressure causes heart disease and increases the risk of stroke. This is more likely to develop in people who have high blood pressure readings or are overweight. ?Talk with your health care provider about your target blood pressure readings. ?Have your blood pressure checked: ?Every 3-5 years if you are 50-69 years of age. ?Every year if you are 59 years old or older. ?If you are between the ages of 37 and 39 and are a current or former smoker, ask your health care provider if you should have a one-time screening for abdominal aortic aneurysm (AAA). ?Diabetes ?Have regular diabetes screenings. This checks your fasting blood sugar level. Have the screening done: ?Once every three years after age 30 if you are at a normal weight and have a low risk for diabetes. ?More often and at a younger age if you are overweight or have a high risk for diabetes. ?What should I know about preventing infection? ?Hepatitis B ?If you have a higher risk for hepatitis B, you should be screened for this virus. Talk with your health care provider to find out if you are at risk for hepatitis B infection. ?Hepatitis C ?Blood testing is recommended for: ?Everyone born from [birth year] through 1964. ?Anyone with known risk factors for hepatitis C. ?Sexually transmitted infections (STIs) ?  You should be screened each year for STIs, including gonorrhea and chlamydia, if: ?You are sexually active and are younger than 40 years of age. ?You are older than 40 years of age and your health care provider tells you that you are at risk for this type of infection. ?Your sexual activity has changed since you were last screened, and you are at increased risk  for chlamydia or gonorrhea. Ask your health care provider if you are at risk. ?Ask your health care provider about whether you are at high risk for HIV. Your health care provider may recommend a prescription medicine to help prevent HIV infection. If you choose to take medicine to prevent HIV, you should first get tested for HIV. You should then be tested every 3 months for as long as you are taking the medicine. ?Follow these instructions at home: ?Alcohol use ?Do not drink alcohol if your health care provider tells you not to drink. ?If you drink alcohol: ?Limit how much you have to 0-2 drinks a day. ?Know how much alcohol is in your drink. In the [place] one drink equals one 12 oz bottle of beer (355 mL), one 5 oz glass of wine (148 mL), or one 1? oz glass of hard liquor (44 mL). ?Lifestyle ?Do not use any products that contain nicotine or tobacco. These products include cigarettes, chewing tobacco, and vaping devices, such as e-cigarettes. If you need help quitting, ask your health care provider. ?Do not use street drugs. ?Do not share needles. ?Ask your health care provider for help if you need support or information about quitting drugs. ?General instructions ?Schedule regular health, dental, and eye exams. ?Stay current with your vaccines. ?Tell your health care provider if: ?You often feel depressed. ?You have ever been abused or do not feel safe at home. ?Summary ?Adopting a healthy lifestyle and getting preventive care are important in promoting health and wellness. ?Follow your health care provider's instructions about healthy diet, exercising, and getting tested or screened for diseases. ?Follow your health care provider's instructions on monitoring your cholesterol and blood pressure. ?This information is not intended to replace advice given to you by your health care provider. Make sure you discuss any questions you have with your health care provider. ?Document Revised: 03/10/2021 Document Reviewed:  03/10/2021 ?Elsevier Patient Education ? [place]. ? ?

## 2022-01-05 NOTE — Assessment & Plan Note (Signed)
Chronic, mild low plt ?ITP - will continue to monitor.  ?

## 2022-01-06 NOTE — Telephone Encounter (Signed)
Updated pt's chart.  

## 2022-01-06 NOTE — Telephone Encounter (Signed)
Discussed at OV

## 2022-12-31 ENCOUNTER — Other Ambulatory Visit: Payer: 59

## 2023-01-02 ENCOUNTER — Other Ambulatory Visit: Payer: Self-pay | Admitting: Family Medicine

## 2023-01-02 DIAGNOSIS — D709 Neutropenia, unspecified: Secondary | ICD-10-CM

## 2023-01-02 DIAGNOSIS — E785 Hyperlipidemia, unspecified: Secondary | ICD-10-CM

## 2023-01-02 DIAGNOSIS — D696 Thrombocytopenia, unspecified: Secondary | ICD-10-CM

## 2023-01-04 ENCOUNTER — Other Ambulatory Visit (INDEPENDENT_AMBULATORY_CARE_PROVIDER_SITE_OTHER): Payer: 59

## 2023-01-04 DIAGNOSIS — D696 Thrombocytopenia, unspecified: Secondary | ICD-10-CM | POA: Diagnosis not present

## 2023-01-04 DIAGNOSIS — E785 Hyperlipidemia, unspecified: Secondary | ICD-10-CM | POA: Diagnosis not present

## 2023-01-04 LAB — CBC WITH DIFFERENTIAL/PLATELET
Basophils Absolute: 0 K/uL (ref 0.0–0.1)
Basophils Relative: 1 % (ref 0.0–3.0)
Eosinophils Absolute: 0.3 K/uL (ref 0.0–0.7)
Eosinophils Relative: 6.8 % — ABNORMAL HIGH (ref 0.0–5.0)
HCT: 42 % (ref 39.0–52.0)
Hemoglobin: 14.4 g/dL (ref 13.0–17.0)
Lymphocytes Relative: 52.3 % — ABNORMAL HIGH (ref 12.0–46.0)
Lymphs Abs: 2 K/uL (ref 0.7–4.0)
MCHC: 34.3 g/dL (ref 30.0–36.0)
MCV: 89.6 fl (ref 78.0–100.0)
Monocytes Absolute: 0.4 K/uL (ref 0.1–1.0)
Monocytes Relative: 10.4 % (ref 3.0–12.0)
Neutro Abs: 1.1 K/uL — ABNORMAL LOW (ref 1.4–7.7)
Neutrophils Relative %: 29.5 % — ABNORMAL LOW (ref 43.0–77.0)
Platelets: 155 K/uL (ref 150.0–400.0)
RBC: 4.69 Mil/uL (ref 4.22–5.81)
RDW: 13.1 % (ref 11.5–15.5)
WBC: 3.9 K/uL — ABNORMAL LOW (ref 4.0–10.5)

## 2023-01-04 LAB — LIPID PANEL
Cholesterol: 226 mg/dL — ABNORMAL HIGH (ref 0–200)
HDL: 44.2 mg/dL (ref >39.00)
LDL Cholesterol: 161 mg/dL — ABNORMAL HIGH (ref 0–99)
NonHDL: 182.03
Total CHOL/HDL Ratio: 5
Triglycerides: 106 mg/dL (ref 0.0–149.0)
VLDL: 21.2 mg/dL (ref 0.0–40.0)

## 2023-01-04 LAB — COMPREHENSIVE METABOLIC PANEL WITH GFR
ALT: 21 U/L (ref 0–53)
AST: 32 U/L (ref 0–37)
Albumin: 4.4 g/dL (ref 3.5–5.2)
Alkaline Phosphatase: 59 U/L (ref 39–117)
BUN: 14 mg/dL (ref 6–23)
CO2: 28 meq/L (ref 19–32)
Calcium: 9.5 mg/dL (ref 8.4–10.5)
Chloride: 102 meq/L (ref 96–112)
Creatinine, Ser: 0.99 mg/dL (ref 0.40–1.50)
GFR: 95.14 mL/min
Glucose, Bld: 94 mg/dL (ref 70–99)
Potassium: 3.8 meq/L (ref 3.5–5.1)
Sodium: 140 meq/L (ref 135–145)
Total Bilirubin: 0.9 mg/dL (ref 0.2–1.2)
Total Protein: 7 g/dL (ref 6.0–8.3)

## 2023-01-08 ENCOUNTER — Encounter: Payer: Self-pay | Admitting: Family Medicine

## 2023-01-08 ENCOUNTER — Ambulatory Visit (INDEPENDENT_AMBULATORY_CARE_PROVIDER_SITE_OTHER): Payer: 59 | Admitting: Family Medicine

## 2023-01-08 VITALS — BP 132/78 | HR 62 | Temp 97.4°F | Ht 76.0 in | Wt 232.2 lb

## 2023-01-08 DIAGNOSIS — E785 Hyperlipidemia, unspecified: Secondary | ICD-10-CM

## 2023-01-08 DIAGNOSIS — K219 Gastro-esophageal reflux disease without esophagitis: Secondary | ICD-10-CM

## 2023-01-08 DIAGNOSIS — Z0000 Encounter for general adult medical examination without abnormal findings: Secondary | ICD-10-CM

## 2023-01-08 DIAGNOSIS — D709 Neutropenia, unspecified: Secondary | ICD-10-CM

## 2023-01-08 DIAGNOSIS — R7401 Elevation of levels of liver transaminase levels: Secondary | ICD-10-CM

## 2023-01-08 DIAGNOSIS — Z23 Encounter for immunization: Secondary | ICD-10-CM | POA: Diagnosis not present

## 2023-01-08 LAB — CBC WITH DIFFERENTIAL/PLATELET
Absolute Monocytes: 350 cells/uL (ref 200–950)
Basophils Absolute: 30 cells/uL (ref 0–200)
Basophils Relative: 0.8 %
Eosinophils Absolute: 171 cells/uL (ref 15–500)
Eosinophils Relative: 4.5 %
HCT: 43.6 % (ref 38.5–50.0)
Hemoglobin: 14.6 g/dL (ref 13.2–17.1)
Lymphs Abs: 1904 cells/uL (ref 850–3900)
MCH: 30.4 pg (ref 27.0–33.0)
MCHC: 33.5 g/dL (ref 32.0–36.0)
MCV: 90.6 fL (ref 80.0–100.0)
MPV: 12.1 fL (ref 7.5–12.5)
Monocytes Relative: 9.2 %
Neutro Abs: 1345 cells/uL — ABNORMAL LOW (ref 1500–7800)
Neutrophils Relative %: 35.4 %
Platelets: 161 10*3/uL (ref 140–400)
RBC: 4.81 10*6/uL (ref 4.20–5.80)
RDW: 12.3 % (ref 11.0–15.0)
Total Lymphocyte: 50.1 %
WBC: 3.8 10*3/uL (ref 3.8–10.8)

## 2023-01-08 LAB — FOLATE: Folate: 19.7 ng/mL

## 2023-01-08 LAB — VITAMIN B12: Vitamin B-12: 229 pg/mL (ref 211–911)

## 2023-01-08 NOTE — Assessment & Plan Note (Addendum)
Encouraged healthy diet choices to improve cholesterol levels.  The 10-year ASCVD risk score (Arnett DK, et al., 2019) is: 1.8%   Values used to calculate the score:     Age: 42 years     Sex: Male     Is Non-Hispanic African American: No     Diabetic: No     Tobacco smoker: No     Systolic Blood Pressure: Q000111Q mmHg     Is BP treated: No     HDL Cholesterol: 44.2 mg/dL     Total Cholesterol: 226 mg/dL

## 2023-01-08 NOTE — Assessment & Plan Note (Signed)
Significant improvement with cutting down on liquor. Rare PPI use.

## 2023-01-08 NOTE — Assessment & Plan Note (Addendum)
Chronic neutropenia since 2019, lymphocyte predominant, with low normal plt count as well.  Last periph smear 2019 reassuring. Update periph smear along with B12 and folate vitamin levels today.  No fever, fatigue, abd pain, night sweats, lymphadenopathy or unexpected weight loss.

## 2023-01-08 NOTE — Assessment & Plan Note (Signed)
Preventative protocols reviewed and updated unless pt declined. Discussed healthy diet and lifestyle.  

## 2023-01-08 NOTE — Patient Instructions (Addendum)
Tdap today  Repeat labs today including vitamin b12 and folate  Good to see you today.  Return as needed or in 1 year for next physical.  May do labs on same day of physical.

## 2023-01-08 NOTE — Progress Notes (Signed)
Patient ID: Anthony Martin, male    DOB: [birth year], 42 y.o.   MRN: [id number]  This visit was conducted in person.  BP 132/78   Pulse 62   Temp (!) 97.4 F (36.3 C) (Temporal)   Ht '6\' 4"'$  (1.93 m)   Wt 232 lb 4 oz (105.3 kg)   SpO2 97%   BMI 28.27 kg/m    CC: CPE Subjective:   HPI: Anthony Martin is a 42 y.o. male presenting on 01/08/2023 for Annual Exam   Lives in [place]. Considering move to [place] over the next year. [place] is his home town.  No regular gym activity.  GERD - overall controlled after cutting out liquor. Takes PRN omeprazole '40mg'$  - rarely.   Notes continued swelling to xiphoid process, present over past 2 yrs.   No fevers/chills, fatigue, night sweats, abd pain, swollen glands.   Preventative: Flu shot - yearly, not this year  Covid vaccine - moderna 07/2020, 05/2021 Tdap 2014, update today  Hep A series completed  Seat belt use discussed  Sunscreen use discussed. No changing moles on skin.  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 Interim medical history since our last visit reviewed. Allergies and medications reviewed and updated. No outpatient medications prior to visit.   No facility-administered medications prior to visit.     Per HPI unless specifically indicated in ROS section below Review of Systems  Constitutional:  Negative for activity change, appetite change, chills, fatigue, fever and unexpected weight change.  HENT:  Negative for hearing loss.   Eyes:   Negative for visual disturbance.  Respiratory:  Negative for cough, chest tightness, shortness of breath and wheezing.   Cardiovascular:  Negative for chest pain, palpitations and leg swelling.  Gastrointestinal:  Negative for abdominal distention, abdominal pain, blood in stool, constipation, diarrhea, nausea and vomiting.  Genitourinary:  Negative for difficulty urinating and hematuria.  Musculoskeletal:  Negative for arthralgias, myalgias and neck pain.  Skin:  Negative for rash.  Neurological:  Negative for dizziness, seizures, syncope and headaches.  Hematological:  Negative for adenopathy. Does not bruise/bleed easily.  Psychiatric/Behavioral:  Negative for dysphoric mood. The patient is not nervous/anxious.     Objective:  BP 132/78   Pulse 62   Temp (!) 97.4 F (36.3 C) (Temporal)   Ht '6\' 4"'$  (1.93 m)   Wt 232 lb 4 oz (105.3 kg)   SpO2 97%   BMI 28.27 kg/m   Wt Readings from Last 3 Encounters:  01/08/23 232 lb 4 oz (105.3 kg)  01/05/22 236 lb 4 oz (107.2 kg)  04/09/20 236 lb 5 oz (107.2 kg)      Physical Exam Vitals and nursing note reviewed.  Constitutional:      General: He is not in acute distress.    Appearance: Normal appearance. He is well-developed. He is not ill-appearing.  HENT:     Head: Normocephalic and atraumatic.     Right Ear: Hearing, tympanic membrane,  ear canal and external ear normal.     Left Ear: Hearing, tympanic membrane, ear canal and external ear normal.  Eyes:     General: No scleral icterus.    Extraocular Movements: Extraocular movements intact.     Conjunctiva/sclera: Conjunctivae normal.     Pupils: Pupils are equal, round, and reactive to light.  Neck:     Thyroid: No thyroid mass or thyromegaly.  Cardiovascular:     Rate and Rhythm: Normal rate and regular rhythm.     Pulses: Normal pulses.          Radial pulses are 2+ on the right side and 2+ on the left side.     Heart sounds: Normal heart sounds. No murmur heard. Pulmonary:      Effort: Pulmonary effort is normal. No respiratory distress.     Breath sounds: Normal breath sounds. No wheezing, rhonchi or rales.     Comments: No swelling appreciated to xyphoid process  Chest:     Chest wall: No tenderness.  Abdominal:     General: Bowel sounds are normal. There is no distension.     Palpations: Abdomen is soft. There is no mass.     Tenderness: There is no abdominal tenderness. There is no guarding or rebound.     Hernia: No hernia is present.  Musculoskeletal:        General: Normal range of motion.     Cervical back: Normal range of motion and neck supple.     Right lower leg: No edema.     Left lower leg: No edema.  Lymphadenopathy:     Head:     Right side of head: No submental, submandibular, tonsillar, preauricular or posterior auricular adenopathy.     Left side of head: No submental, submandibular, tonsillar, preauricular or posterior auricular adenopathy.     Cervical: No cervical adenopathy.     Right cervical: No superficial cervical adenopathy.    Left cervical: No superficial cervical adenopathy.     Upper Body:     Right upper body: No supraclavicular adenopathy.     Left upper body: No supraclavicular adenopathy.  Skin:    General: Skin is warm and dry.     Findings: No rash.  Neurological:     General: No focal deficit present.     Mental Status: He is alert and oriented to person, place, and time.  Psychiatric:        Mood and Affect: Mood normal.        Behavior: Behavior normal.        Thought Content: Thought content normal.        Judgment: Judgment normal.       Results for orders placed or performed in visit on 01/04/23  CBC with Differential/Platelet  Result Value Ref Range   WBC 3.9 (L) 4.0 - 10.5 K/uL   RBC 4.69 4.22 - 5.81 Mil/uL   Hemoglobin 14.4 13.0 - 17.0 g/dL   HCT 42.0 39.0 - 52.0 %   MCV 89.6 78.0 - 100.0 fl   MCHC 34.3 30.0 - 36.0 g/dL   RDW 13.1 11.5 - 15.5 %   Platelets 155.0 150.0 - 400.0 K/uL    Neutrophils Relative % 29.5 (L) 43.0 - 77.0 %   Lymphocytes Relative 52.3 (H) 12.0 - 46.0 %   Monocytes Relative 10.4 3.0 - 12.0 %   Eosinophils Relative 6.8 (H) 0.0 - 5.0 %   Basophils Relative 1.0 0.0 - 3.0 %  Neutro Abs 1.1 (L) 1.4 - 7.7 K/uL   Lymphs Abs 2.0 0.7 - 4.0 K/uL   Monocytes Absolute 0.4 0.1 - 1.0 K/uL   Eosinophils Absolute 0.3 0.0 - 0.7 K/uL   Basophils Absolute 0.0 0.0 - 0.1 K/uL  Comprehensive metabolic panel  Result Value Ref Range   Sodium 140 135 - 145 mEq/L   Potassium 3.8 3.5 - 5.1 mEq/L   Chloride 102 96 - 112 mEq/L   CO2 28 19 - 32 mEq/L   Glucose, Bld 94 70 - 99 mg/dL   BUN 14 6 - 23 mg/dL   Creatinine, Ser 0.99 0.40 - 1.50 mg/dL   Total Bilirubin 0.9 0.2 - 1.2 mg/dL   Alkaline Phosphatase 59 39 - 117 U/L   AST 32 0 - 37 U/L   ALT 21 0 - 53 U/L   Total Protein 7.0 6.0 - 8.3 g/dL   Albumin 4.4 3.5 - 5.2 g/dL   GFR 95.14 >60.00 mL/min   Calcium 9.5 8.4 - 10.5 mg/dL  Lipid panel  Result Value Ref Range   Cholesterol 226 (H) 0 - 200 mg/dL   Triglycerides 106.0 0.0 - 149.0 mg/dL   HDL 44.20 >39.00 mg/dL   VLDL 21.2 0.0 - 40.0 mg/dL   LDL Cholesterol 161 (H) 0 - 99 mg/dL   Total CHOL/HDL Ratio 5    NonHDL 182.03     Assessment & Plan:   Problem List Items Addressed This Visit     Health maintenance examination - Primary (Chronic)    Preventative protocols reviewed and updated unless pt declined. Discussed healthy diet and lifestyle.       Neutropenia (HCC)    Chronic neutropenia since 2019, lymphocyte predominant, with low normal plt count as well.  Last periph smear 2019 reassuring. Update periph smear along with B12 and folate vitamin levels today.  No fever, fatigue, abd pain, night sweats, lymphadenopathy or unexpected weight loss.       Relevant Orders   Vitamin B12   Folate   Pathologist smear review   CBC with Differential/Platelet   GERD (gastroesophageal reflux disease)    Significant improvement with cutting down on liquor.  Rare PPI use.       Dyslipidemia    Encouraged healthy diet choices to improve cholesterol levels.  The 10-year ASCVD risk score (Arnett DK, et al., 2019) is: 1.8%   Values used to calculate the score:     Age: 19 years     Sex: Male     Is Non-Hispanic African American: No     Diabetic: No     Tobacco smoker: No     Systolic Blood Pressure: Q000111Q mmHg     Is BP treated: No     HDL Cholesterol: 44.2 mg/dL     Total Cholesterol: 226 mg/dL       Transaminitis    Improved with decrease in liquor intake.       Other Visit Diagnoses     Need for Tdap vaccination       Relevant Orders   Tdap vaccine greater than or equal to 7yo IM (Completed)        No orders of the defined types were placed in this encounter.   Orders Placed This Encounter  Procedures   Tdap vaccine greater than or equal to 7yo IM   Vitamin B12   Folate   Pathologist smear review   CBC with Differential/Platelet    Patient Instructions  Tdap today  Repeat  labs today including vitamin b12 and folate  Good to see you today.  Return as needed or in 1 year for next physical.  May do labs on same day of physical.  Follow up plan: Return in about 1 year (around 01/08/2024) for annual exam, prior fasting for blood work.  Ria Bush, MD

## 2023-01-08 NOTE — Assessment & Plan Note (Signed)
Improved with decrease in liquor intake.

## 2023-01-11 LAB — PATHOLOGIST SMEAR REVIEW

## 2023-01-16 ENCOUNTER — Other Ambulatory Visit: Payer: Self-pay | Admitting: Family Medicine

## 2023-01-16 ENCOUNTER — Encounter: Payer: Self-pay | Admitting: Family Medicine

## 2023-01-16 DIAGNOSIS — E538 Deficiency of other specified B group vitamins: Secondary | ICD-10-CM | POA: Insufficient documentation

## 2023-01-16 MED ORDER — CYANOCOBALAMIN 500 MCG PO TABS: 500.0000 ug | ORAL_TABLET | Freq: Every day | ORAL | Status: AC

## 2023-06-16 ENCOUNTER — Encounter: Payer: Self-pay | Admitting: Family Medicine

## 2023-06-16 ENCOUNTER — Telehealth: Payer: Self-pay | Admitting: Family Medicine

## 2023-06-16 DIAGNOSIS — K219 Gastro-esophageal reflux disease without esophagitis: Secondary | ICD-10-CM

## 2023-06-16 MED ORDER — OMEPRAZOLE 40 MG PO CPDR: 40.0000 mg | DELAYED_RELEASE_CAPSULE | Freq: Every day | ORAL | 2 refills | Status: AC

## 2023-06-16 NOTE — Telephone Encounter (Signed)
error 

## 2023-06-16 NOTE — Telephone Encounter (Signed)
Patient called regarding past medication he has taken (omeprazole), rx has expired and patient wants to know if this can be re sent in? Patient would like this to be sent to preferred pharmacy on file

## 2023-06-16 NOTE — Telephone Encounter (Signed)
Sent rx to [facility], [place].

## 2024-01-10 ENCOUNTER — Encounter: Payer: 59 | Admitting: Family Medicine
